# Patient Record
Sex: Female | Born: 1945 | Race: Black or African American | Hispanic: No | Smoking: Never smoker
Health system: Southern US, Community
[De-identification: ages and names within clinical notes are randomized; demographics above are authoritative.]

## PROBLEM LIST (undated history)

## (undated) DIAGNOSIS — K219 Gastro-esophageal reflux disease without esophagitis: Secondary | ICD-10-CM

## (undated) DIAGNOSIS — R519 Headache, unspecified: Secondary | ICD-10-CM

## (undated) DIAGNOSIS — M1990 Unspecified osteoarthritis, unspecified site: Secondary | ICD-10-CM

## (undated) DIAGNOSIS — Z87442 Personal history of urinary calculi: Secondary | ICD-10-CM

## (undated) DIAGNOSIS — G4730 Sleep apnea, unspecified: Secondary | ICD-10-CM

## (undated) DIAGNOSIS — E119 Type 2 diabetes mellitus without complications: Secondary | ICD-10-CM

## (undated) DIAGNOSIS — I10 Essential (primary) hypertension: Secondary | ICD-10-CM

## (undated) DIAGNOSIS — D649 Anemia, unspecified: Secondary | ICD-10-CM

## (undated) HISTORY — PX: WISDOM TOOTH EXTRACTION: SHX21

## (undated) HISTORY — PX: REPLACEMENT TOTAL KNEE BILATERAL: SUR1225

## (undated) HISTORY — PX: TONSILLECTOMY: SUR1361

## (undated) HISTORY — PX: EYE SURGERY: SHX253

## (undated) HISTORY — PX: CHOLECYSTECTOMY: SHX55

## (undated) HISTORY — PX: ANKLE SURGERY: SHX546

## (undated) HISTORY — PX: HAND SURGERY: SHX662

---

## 2018-08-11 ENCOUNTER — Ambulatory Visit (HOSPITAL_COMMUNITY)
Admission: EM | Admit: 2018-08-11 | Discharge: 2018-08-11 | Disposition: A | Discharge status: Home / Self Care | Payer: Medicare HMO | Attending: Family Medicine | Admitting: Family Medicine

## 2018-08-11 ENCOUNTER — Encounter (HOSPITAL_COMMUNITY): Payer: Self-pay

## 2018-08-11 ENCOUNTER — Other Ambulatory Visit: Payer: Self-pay

## 2018-08-11 DIAGNOSIS — M545 Low back pain, unspecified: Secondary | ICD-10-CM

## 2018-08-11 HISTORY — DX: Type 2 diabetes mellitus without complications: E11.9

## 2018-08-11 HISTORY — DX: Essential (primary) hypertension: I10

## 2018-08-11 MED ORDER — KETOROLAC TROMETHAMINE 30 MG/ML IJ SOLN
INTRAMUSCULAR | Status: AC
  Filled 2018-08-11: qty 1

## 2018-08-11 MED ORDER — KETOROLAC TROMETHAMINE 30 MG/ML IJ SOLN
30.0000 mg | Freq: Once | INTRAMUSCULAR | Status: AC
  Administered 2018-08-11: 30 mg via INTRAMUSCULAR

## 2018-08-11 NOTE — ED Triage Notes (Signed)
Pt presents today with left sided back pain. States it has been bothering her all week but got worse last night. Has not had any urinary sxs. Also states she started with sneezing yesterday and thinks she may have a cold.

## 2018-08-11 NOTE — ED Provider Notes (Signed)
[facility]   [id number] 08/11/18 Arrival Time: 1315  CC: Back pain  SUBJECTIVE: History from: patient. Alexandria Gibbs is a 73 y.o. female complains of left low back pain that began 1 week ago.  Denies a precipitating event or specific injury.  Localizes the pain to the left low back.  Describes the pain as intermittent and achy in character.  Has tried OTC medications like tylenol without relief.  Symptoms are made worse with standing from a seated position.  Reports similar symptoms in the past and diagnosed with pinched nerve, but pain today not as severe.  Denies fever, chills, erythema, ecchymosis, effusion, weakness, numbness and tingling, saddle paresthesias, loss of bowel or bladder function.    ROS: As per HPI.  Past Medical History:  Diagnosis Date  . Diabetes mellitus without complication (HCC)   . Hypertension    Past Surgical History:  Procedure Laterality Date  . CHOLECYSTECTOMY    . REPLACEMENT TOTAL KNEE BILATERAL    . TONSILLECTOMY     Allergies  Allergen Reactions  . Sulfa Antibiotics     Swelling and itching   No current facility-administered medications on file prior to encounter.    Current Outpatient Medications on File Prior to Encounter  Medication Sig Dispense Refill  . Exenatide ER (BYDUREON) 2 MG PEN Inject into the skin.    . metFORMIN (GLUCOPHAGE) 500 MG tablet 500 mg 2 (two) times daily with a meal.      Social History   Socioeconomic History  . Marital status: Widowed    Spouse name: Not on file  . Number of children: Not on file  . Years of education: Not on file  . Highest education level: Not on file  Occupational History  . Not on file  Social Needs  . Financial resource strain: Not on file  . Food insecurity:    Worry: Not on file    Inability: Not on file  . Transportation needs:    Medical: Not on file    Non-medical: Not on file  Tobacco Use  . Smoking status: Never Smoker  . Smokeless tobacco: Never Used    Substance and Sexual Activity  . Alcohol use: Not Currently  . Drug use: Not Currently  . Sexual activity: Not on file  Lifestyle  . Physical activity:    Days per week: Not on file    Minutes per session: Not on file  . Stress: Not on file  Relationships  . Social connections:    Talks on phone: Not on file    Gets together: Not on file    Attends religious service: Not on file    Active member of club or organization: Not on file    Attends meetings of clubs or organizations: Not on file    Relationship status: Not on file  . Intimate partner violence:    Fear of current or ex partner: Not on file    Emotionally abused: Not on file    Physically abused: Not on file    Forced sexual activity: Not on file  Other Topics Concern  . Not on file  Social History Narrative  . Not on file   No family history on file.  OBJECTIVE:  Vitals:   08/11/18 1349  BP: 135/65  Pulse: 71  Resp: 16  Temp: 98.4 F (36.9 C)  TempSrc: Oral  SpO2: 98%    General appearance: Alert; in no acute distress.  Head: NCAT Lungs: CTA bilaterally Heart: Soft  systolic murmur over aortic region Musculoskeletal: Back Inspection: Skin warm, dry, clear and intact without obvious erythema, effusion, or ecchymosis.  Palpation: TTP over left lower paravertebral muscles ROM: FROM active and passive Strength: 5/5 shld abduction, 5/5 shld adduction, 5/5 elbow flexion, 5/5 elbow extension, 5/5 grip strength, 5/5 hip flexion, 5/5 knee abduction, 5/5 knee adduction, 5/5 knee flexion, 5/5 knee extension, 5/5 dorsiflexion, 5/5 plantar flexion Skin: warm and dry Neurologic: Ambulates without difficulty; Sensation intact about the upper/ lower extremities Psychological: alert and cooperative; normal mood and affect  ASSESSMENT & PLAN:  1. Acute left-sided low back pain without sciatica     Meds ordered this encounter  Medications  . ketorolac (TORADOL) 30 MG/ML injection 30 mg   Toradol shot given in  office Continue conservative management of rest, ice, and gentle stretches Follow up with PCP or with Joaquin CourtsKimberly Harris FNP if symptoms persist Return or go to the ER if you have any new or worsening symptoms (fever, chills, chest pain, abdominal pain, changes in bowel or bladder habits, pain radiating into lower legs, etc...)   Reviewed expectations re: course of current medical issues. Questions answered. Outlined signs and symptoms indicating need for more acute intervention. Patient verbalized understanding. After Visit Summary given.    Rennis HardingWurst, Yaritzi Craun, PA-C 08/11/18 1459

## 2018-08-11 NOTE — Discharge Instructions (Signed)
Toradol shot given in office Continue conservative management of rest, ice, and gentle stretches Follow up with PCP or with Joaquin CourtsKimberly Harris FNP if symptoms persist Return or go to the ER if you have any new or worsening symptoms (fever, chills, chest pain, abdominal pain, changes in bowel or bladder habits, pain radiating into lower legs, etc...)

## 2018-09-17 ENCOUNTER — Encounter (HOSPITAL_COMMUNITY): Payer: Self-pay | Admitting: Emergency Medicine

## 2018-09-17 ENCOUNTER — Other Ambulatory Visit: Payer: Self-pay

## 2018-09-17 ENCOUNTER — Emergency Department (HOSPITAL_COMMUNITY)
Admission: EM | Admit: 2018-09-17 | Discharge: 2018-09-17 | Disposition: A | Discharge status: Home / Self Care | Payer: Medicare HMO | Attending: Emergency Medicine | Admitting: Emergency Medicine

## 2018-09-17 DIAGNOSIS — Z5321 Procedure and treatment not carried out due to patient leaving prior to being seen by health care provider: Secondary | ICD-10-CM | POA: Diagnosis not present

## 2018-09-17 DIAGNOSIS — R109 Unspecified abdominal pain: Secondary | ICD-10-CM | POA: Insufficient documentation

## 2018-09-17 NOTE — ED Notes (Signed)
Called for vitals x4. No reply.

## 2018-09-17 NOTE — ED Notes (Signed)
No reply x3. Not seen in lobby.

## 2018-09-17 NOTE — ED Triage Notes (Signed)
Pt c/o left flank pain x 3 months. Reports urinary frequency.

## 2018-09-18 ENCOUNTER — Encounter (HOSPITAL_COMMUNITY): Payer: Self-pay | Admitting: *Deleted

## 2018-09-18 ENCOUNTER — Emergency Department (HOSPITAL_COMMUNITY): Payer: Medicare HMO

## 2018-09-18 ENCOUNTER — Other Ambulatory Visit: Payer: Self-pay

## 2018-09-18 ENCOUNTER — Emergency Department (HOSPITAL_COMMUNITY)
Admission: EM | Admit: 2018-09-18 | Discharge: 2018-09-18 | Disposition: A | Discharge status: Home / Self Care | Payer: Medicare HMO | Attending: Emergency Medicine | Admitting: Emergency Medicine

## 2018-09-18 DIAGNOSIS — E119 Type 2 diabetes mellitus without complications: Secondary | ICD-10-CM | POA: Diagnosis not present

## 2018-09-18 DIAGNOSIS — I10 Essential (primary) hypertension: Secondary | ICD-10-CM | POA: Diagnosis not present

## 2018-09-18 DIAGNOSIS — Z96653 Presence of artificial knee joint, bilateral: Secondary | ICD-10-CM | POA: Diagnosis not present

## 2018-09-18 DIAGNOSIS — R109 Unspecified abdominal pain: Secondary | ICD-10-CM | POA: Insufficient documentation

## 2018-09-18 HISTORY — DX: Gastro-esophageal reflux disease without esophagitis: K21.9

## 2018-09-18 LAB — URINALYSIS, ROUTINE W REFLEX MICROSCOPIC
Bacteria, UA: NONE SEEN
Bilirubin Urine: NEGATIVE
Glucose, UA: NEGATIVE mg/dL
Hgb urine dipstick: NEGATIVE
KETONES UR: NEGATIVE mg/dL
Nitrite: NEGATIVE
Protein, ur: NEGATIVE mg/dL
Specific Gravity, Urine: 1.021 (ref 1.005–1.030)
pH: 5 (ref 5.0–8.0)

## 2018-09-18 MED ORDER — METHOCARBAMOL 500 MG PO TABS: 500.0000 mg | ORAL_TABLET | Freq: Three times a day (TID) | ORAL | 0 refills | Status: DC | PRN

## 2018-09-18 MED ORDER — IBUPROFEN 400 MG PO TABS: 400.0000 mg | ORAL_TABLET | Freq: Three times a day (TID) | ORAL | 0 refills | Status: DC | PRN

## 2018-09-18 MED ORDER — KETOROLAC TROMETHAMINE 60 MG/2ML IM SOLN
30.0000 mg | Freq: Once | INTRAMUSCULAR | Status: AC
  Administered 2018-09-18: 30 mg via INTRAMUSCULAR
  Filled 2018-09-18: qty 2

## 2018-09-18 MED ORDER — LORATADINE 10 MG PO TABS: 10.0000 mg | ORAL_TABLET | Freq: Every day | ORAL | 0 refills | Status: DC

## 2018-09-18 MED ORDER — METHOCARBAMOL 500 MG PO TABS
500.0000 mg | ORAL_TABLET | Freq: Once | ORAL | Status: AC
  Administered 2018-09-18: 500 mg via ORAL
  Filled 2018-09-18: qty 1

## 2018-09-18 NOTE — ED Provider Notes (Signed)
Emergency Department Provider Note   I have reviewed the triage vital signs and the nursing notes.   HISTORY  Chief Complaint Flank Pain (left)   HPI Alexandria Gibbs is a 73 y.o. female with medical problems documented below who presents emerged from today with left flank pain.  Patient states that she started having flank pain about a month ago seems to start in her back and radiate around her side to her anterior groin.  She been seen somewhere and got shot which seemed to make it better for a little while but then progressively came back.  Does seem to be worse with movement specifically leaning to the left.  She denies any rashes.  She had no urinary symptoms.  No nausea vomiting fever.  No other associated symptoms. No other associated or modifying symptoms.    Past Medical History:  Diagnosis Date  . Diabetes mellitus without complication (HCC)   . GERD (gastroesophageal reflux disease)   . Hypertension     There are no active problems to display for this patient.   Past Surgical History:  Procedure Laterality Date  . ANKLE SURGERY Left    ligament repair  . CHOLECYSTECTOMY    . HAND SURGERY Right   . REPLACEMENT TOTAL KNEE BILATERAL    . TONSILLECTOMY      Current Outpatient Rx  . Order #: [id number] Class: Historical Med  . Order #: [id number] Class: Historical Med  . Order #: [id number] Class: Historical Med  . Order #: [id number] Class: Historical Med  . Order #: [id number] Class: Historical Med  . Order #: [id number] Class: Historical Med  . Order #: [id number] Class: Historical Med  . Order #: [id number] Class: Print  . Order #: [id number] Class: Print  . Order #: [id number] Class: Print    Allergies Sulfa antibiotics  No family history on file.  Social History Social History   Tobacco Use  . Smoking status: Never Smoker  . Smokeless tobacco: Never Used  Substance Use Topics  . Alcohol use: Not Currently  . Drug use: Not Currently    Review of  Systems  All other systems negative except as documented in the HPI. All pertinent positives and negatives as reviewed in the HPI. ____________________________________________   PHYSICAL EXAM:  VITAL SIGNS: ED Triage Vitals  Enc Vitals Group     BP 09/18/18 1342 (!) 138/57     Pulse Rate 09/18/18 1342 81     Resp 09/18/18 1342 16     Temp 09/18/18 1342 98.7 F (37.1 C)     Temp Source 09/18/18 1342 Oral     SpO2 09/18/18 1342 99 %     Weight 09/18/18 1343 215 lb (97.5 kg)     Height 09/18/18 1343 5\' 5"  (1.651 m)     Head Circumference --      Peak Flow --      Pain Score 09/18/18 1342 6     Pain Loc --      Pain Edu? --      Excl. in GC? --     Constitutional: Alert and oriented. Well appearing and in no acute distress. Eyes: Conjunctivae are normal. PERRL. EOMI. Head: Atraumatic. Nose: No congestion/rhinnorhea. Mouth/Throat: Mucous membranes are moist.  Oropharynx non-erythematous. Neck: No stridor.  No meningeal signs.   Cardiovascular: Normal rate, regular rhythm. Good peripheral circulation. Grossly normal heart sounds.   Respiratory: Normal respiratory effort.  No retractions. Lungs CTAB. Gastrointestinal: Soft and nontender. No distention.  Musculoskeletal: No lower extremity tenderness nor edema. No gross deformities  of extremities. ttp to left paraspinal around upper lumbar area and left flank over lower ribs. No rash. No deformities.  Neurologic:  Normal speech and language. No gross focal neurologic deficits are appreciated.  Skin:  Skin is warm, dry and intact. No rash noted.   ____________________________________________   LABS (all labs ordered are listed, but only abnormal results are displayed)  Labs Reviewed  URINALYSIS, ROUTINE W REFLEX MICROSCOPIC - Abnormal; Notable for the following components:      Result Value   Leukocytes,Ua SMALL (*)    All other components within normal limits    ____________________________________________  RADIOLOGY  Dg Chest 2 View  Result Date: 09/18/2018 CLINICAL DATA:  Initial evaluation for acute left flank pain. EXAM: CHEST - 2 VIEW COMPARISON:  None. FINDINGS: Cardiac and mediastinal silhouettes are within normal limits. Aortic atherosclerosis noted. Lungs normally inflated. No focal infiltrates, pulmonary edema, or pleural effusion. No pneumothorax. No acute osseous finding. Prominent degenerative changes noted about the shoulders bilaterally. Multilevel endplate spurring noted within the thoracic spine. IMPRESSION: No active cardiopulmonary disease. Electronically Signed   By: Rise Mu M.D.   On: 09/18/2018 18:31   Dg Thoracic Spine W/swimmers  Result Date: 09/18/2018 CLINICAL DATA:  Initial evaluation for acute left flank pain. EXAM: THORACIC SPINE - 3 VIEWS COMPARISON:  None. FINDINGS: Dextroscoliosis of the upper thoracic spine. Alignment otherwise normal with preservation of the normal thoracic kyphosis. Vertebral body heights maintained without evidence for acute or chronic fracture. Moderate multilevel degenerative endplate spurring seen throughout the thoracic spine. No discrete osseous lesions. Visualized soft tissues within normal limits. IMPRESSION: 1. No radiographic evidence for acute abnormality within the thoracic spine. 2. Moderate multilevel degenerative endplate spurring throughout the thoracic spine. Electronically Signed   By: Rise Mu M.D.   On: 09/18/2018 18:35   Dg Lumbar Spine Complete  Result Date: 09/18/2018 CLINICAL DATA:  Initial evaluation for acute left flank pain. EXAM: LUMBAR SPINE - COMPLETE 4+ VIEW COMPARISON:  None. FINDINGS: Five non rib-bearing lumbar type vertebral bodies are present. Trace grade 1 anterolisthesis of L4 on L5. Alignment otherwise normal with preservation of the normal lumbar lordosis. Vertebral body heights maintained without evidence for acute or chronic fracture.  Visualized sacrum and pelvis intact. No discrete osseous lesions. Moderate degenerative facet arthrosis present within the lower lumbar spine. Multilevel prominent bridging endplate osteophytic spurring noted, most notable on the right. No acute soft tissue abnormality. Vascular calcifications noted within the abdomen. Cholecystectomy clips noted as well. IMPRESSION: 1. No radiographic evidence for acute abnormality within the lumbar spine. 2. 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 Electronically Signed   By: Rise Mu M.D.   On: 09/18/2018 18:29    ____________________________________________   PROCEDURES  Procedure(s) performed:   Procedures   ____________________________________________   INITIAL IMPRESSION / ASSESSMENT AND PLAN / ED COURSE  Skeletal lesion versus radiculopathy.  Has been going on for a while and she is 73 years old with history of diabetes we will get an x-ray and treat her symptomatically but if this is normal she will need to follow-up with a PCP for further work-up and management.  xr's ok. Symptoms improved. No e/o UTI. Low suspicion for kidney stone. Will fu w/ pcp for further management if not improving.   Pertinent labs & imaging results that were available during my care of the patient were reviewed by me and considered in my medical decision making (see chart for details).  ____________________________________________  FINAL CLINICAL  IMPRESSION(S) / ED DIAGNOSES  Final diagnoses:  Flank pain  Left flank pain     MEDICATIONS GIVEN DURING THIS VISIT:  Medications  ketorolac (TORADOL) injection 30 mg (30 mg Intramuscular Given 09/18/18 1646)  methocarbamol (ROBAXIN) tablet 500 mg (500 mg Oral Given 09/18/18 1646)     NEW OUTPATIENT MEDICATIONS STARTED DURING THIS VISIT:  Discharge Medication List as of 09/18/2018  7:20 PM    START taking these medications   Details  ibuprofen  (ADVIL,MOTRIN) 400 MG tablet Take 1 tablet (400 mg total) by mouth every 8 (eight) hours as needed., Starting Tue 09/18/2018, Print    loratadine (CLARITIN) 10 MG tablet Take 1 tablet (10 mg total) by mouth daily., Starting Tue 09/18/2018, Print    methocarbamol (ROBAXIN) 500 MG tablet Take 1 tablet (500 mg total) by mouth every 8 (eight) hours as needed for muscle spasms., Starting Tue 09/18/2018, Print        Note:  This note was prepared with assistance of Dragon voice recognition software. Occasional wrong-word or sound-a-like substitutions may have occurred due to the inherent limitations of voice recognition software.   Marily Memos, MD 09/18/18 [phone]

## 2018-09-18 NOTE — ED Triage Notes (Signed)
Pt states that she has been having left flank pain that started on Saturday.  Pt states that the pain feels like it is coming around her back and to the front.  Pt states that a month ago she had similar pain in which she went to UC and was given a shot.  Pt reports increased frequency in urination but denies any other urinary symptoms.  Pt a/o x 4 and ambulatory in triage.

## 2019-02-27 ENCOUNTER — Other Ambulatory Visit: Payer: Self-pay

## 2019-02-27 ENCOUNTER — Ambulatory Visit (HOSPITAL_COMMUNITY)
Admission: EM | Admit: 2019-02-27 | Discharge: 2019-02-27 | Disposition: A | Discharge status: Home / Self Care | Payer: Medicare HMO | Attending: Family Medicine | Admitting: Family Medicine

## 2019-02-27 ENCOUNTER — Encounter (HOSPITAL_COMMUNITY): Payer: Self-pay | Admitting: Emergency Medicine

## 2019-02-27 DIAGNOSIS — Z96653 Presence of artificial knee joint, bilateral: Secondary | ICD-10-CM | POA: Insufficient documentation

## 2019-02-27 DIAGNOSIS — Z20828 Contact with and (suspected) exposure to other viral communicable diseases: Secondary | ICD-10-CM | POA: Insufficient documentation

## 2019-02-27 DIAGNOSIS — Z7984 Long term (current) use of oral hypoglycemic drugs: Secondary | ICD-10-CM | POA: Diagnosis not present

## 2019-02-27 DIAGNOSIS — E119 Type 2 diabetes mellitus without complications: Secondary | ICD-10-CM | POA: Diagnosis not present

## 2019-02-27 DIAGNOSIS — Z882 Allergy status to sulfonamides status: Secondary | ICD-10-CM | POA: Insufficient documentation

## 2019-02-27 DIAGNOSIS — K219 Gastro-esophageal reflux disease without esophagitis: Secondary | ICD-10-CM | POA: Insufficient documentation

## 2019-02-27 DIAGNOSIS — J029 Acute pharyngitis, unspecified: Secondary | ICD-10-CM | POA: Insufficient documentation

## 2019-02-27 DIAGNOSIS — Z79899 Other long term (current) drug therapy: Secondary | ICD-10-CM | POA: Diagnosis not present

## 2019-02-27 DIAGNOSIS — I10 Essential (primary) hypertension: Secondary | ICD-10-CM | POA: Insufficient documentation

## 2019-02-27 LAB — POCT RAPID STREP A: Streptococcus, Group A Screen (Direct): NEGATIVE

## 2019-02-27 NOTE — ED Triage Notes (Signed)
Pt sts sore throat denies fever

## 2019-02-27 NOTE — Discharge Instructions (Signed)
Negative rapid strep.  Continue with your allergy medications. A daily nasal spray may also help.  Throat lozenges, gargles, chloraseptic spray, warm teas, popsicles etc to help with throat pain.   Will notify you if you have a positive covid test. You may monitor your results on your MyChart online as well.   If symptoms worsen or do not improve in the next week to return to be seen or to follow up with your PCP.

## 2019-02-27 NOTE — ED Provider Notes (Signed)
[facility]    CSN: [id number] Arrival date & time: 02/27/19  1513     History   Chief Complaint Chief Complaint  Patient presents with  . Sore Throat    HPI Alexandria Gibbs is a 73 y.o. female.   Alexandria Gibbs presents with complaints of sore throat. Started 1 week ago. Worse during the day, feels like her throat is dry. She feels like it is worse when she is in air conditioned environment. She does get post nasal drip. She takes allergy medications. Yesterday had body aches. No fevers. No headache. No gi symptoms. No cough no chest pain. No ear pain. She is concerned about covid-19. Her daughter has had a cough and congestion, and her daughter has an aid who helps her. Patient states the aid does not wear a mask. Symptoms are not worse at night. History  Of dm, gerd, htn, tonsillectomy.     ROS per HPI, negative if not otherwise mentioned.      Past Medical History:  Diagnosis Date  . Diabetes mellitus without complication (HCC)   . GERD (gastroesophageal reflux disease)   . Hypertension     There are no active problems to display for this patient.   Past Surgical History:  Procedure Laterality Date  . ANKLE SURGERY Left    ligament repair  . CHOLECYSTECTOMY    . HAND SURGERY Right   . REPLACEMENT TOTAL KNEE BILATERAL    . TONSILLECTOMY      OB History   No obstetric history on file.      Home Medications    Prior to Admission medications   Medication Sig Start Date End Date Taking? Authorizing Provider  benazepril-hydrochlorthiazide (LOTENSIN HCT) 20-12.5 MG tablet Take 1 tablet by mouth daily.  08/21/18   [provider]  Exenatide ER (BYDUREON) 2 MG PEN Inject 2 mg into the skin once a week.     [provider]  ibuprofen (ADVIL,MOTRIN) 400 MG tablet Take 1 tablet (400 mg total) by mouth every 8 (eight) hours as needed. 09/18/18   Mesner, Barbara CowerJason, MD  loratadine (CLARITIN) 10 MG tablet Take 1 tablet (10 mg total) by mouth  daily. 09/18/18   Mesner, Barbara CowerJason, MD  metFORMIN (GLUCOPHAGE) 500 MG tablet Take 500 mg by mouth 2 (two) times daily with a meal.    [provider]  methocarbamol (ROBAXIN) 500 MG tablet Take 1 tablet (500 mg total) by mouth every 8 (eight) hours as needed for muscle spasms. 09/18/18   Mesner, Barbara CowerJason, MD  pantoprazole (PROTONIX) 40 MG tablet Take 40 mg by mouth daily.  08/21/18   [provider]  pravastatin (PRAVACHOL) 40 MG tablet Take 40 mg by mouth daily.    [provider]  Prenatal 27-1 MG TABS Take 1 tablet by mouth daily. 07/02/18   [provider]  traMADol (ULTRAM) 50 MG tablet Take 50 mg by mouth every 6 (six) hours as needed for moderate pain or severe pain.    [provider]    Family History History reviewed. No pertinent family history.  Social History Social History   Tobacco Use  . Smoking status: Never Smoker  . Smokeless tobacco: Never Used  Substance Use Topics  . Alcohol use: Not Currently  . Drug use: Not Currently     Allergies   Sulfa antibiotics   Review of Systems Review of Systems   Physical Exam Triage Vital Signs ED Triage Vitals  Enc Vitals Group  BP 02/27/19 1534 (!) 160/76     Pulse Rate 02/27/19 1534 78     Resp 02/27/19 1534 18     Temp 02/27/19 1534 98.5 F (36.9 C)     Temp Source 02/27/19 1534 Oral     SpO2 02/27/19 1534 97 %     Weight --      Height --      Head Circumference --      Peak Flow --      Pain Score 02/27/19 1535 5     Pain Loc --      Pain Edu? --      Excl. in [place]? --    No data found.  Updated Vital Signs BP (!) 160/76 (BP Location: Right Arm)   Pulse 78   Temp 98.5 F (36.9 C) (Oral)   Resp 18   SpO2 97%    Physical Exam Constitutional:      General: She is not in acute distress.    Appearance: She is well-developed.  HENT:     Mouth/Throat:     Pharynx: No posterior oropharyngeal erythema.     Tonsils: 0 on the right. 0 on the left.   Cardiovascular:     Rate and Rhythm: Normal rate.     Heart sounds: Normal heart sounds.  Pulmonary:     Effort: Pulmonary effort is normal.  Skin:    General: Skin is warm and dry.  Neurological:     Mental Status: She is alert and oriented to person, place, and time.      UC Treatments / Results  Labs (all labs ordered are listed, but only abnormal results are displayed) Labs Reviewed  NOVEL CORONAVIRUS, NAA (HOSPITAL ORDER, SEND-OUT TO REF LAB)  CULTURE, GROUP A STREP [facility])  POCT RAPID STREP A    EKG   Radiology No results found.  Procedures Procedures (including critical care time)  Medications Ordered in UC Medications - No data to display  Initial Impression / Assessment and Plan / UC Course  I have reviewed the triage vital signs and the nursing notes.  Pertinent labs & imaging results that were available during my care of the patient were reviewed by me and considered in my medical decision making (see chart for details).     Non toxic. Benign physical exam.  Negative rapid strep. Supportive cares recommended. covid pending. Will notify of any positive findings and if any changes to treatment are needed.  If symptoms worsen or do not improve in the next week to return to be seen or to follow up with PCP. Patient verbalized understanding and agreeable to plan.  Ambulatory out of clinic without difficulty.    Final Clinical Impressions(s) / UC Diagnoses   Final diagnoses:  Acute pharyngitis, unspecified etiology     Discharge Instructions     Negative rapid strep.  Continue with your allergy medications. A daily nasal spray may also help.  Throat lozenges, gargles, chloraseptic spray, warm teas, popsicles etc to help with throat pain.   Will notify you if you have a positive covid test. You may monitor your results on your MyChart online as well.   If symptoms worsen or do not improve in the next week to return to be seen or to follow up with your PCP.       ED Prescriptions    None     Controlled Substance Prescriptions [place] Controlled Substance Registry consulted? Not Applicable   Zigmund Gottron, NP 02/27/19  1558  

## 2019-03-01 LAB — NOVEL CORONAVIRUS, NAA (HOSP ORDER, SEND-OUT TO REF LAB; TAT 18-24 HRS): SARS-CoV-2, NAA: NOT DETECTED

## 2019-03-02 LAB — CULTURE, GROUP A STREP (THRC)

## 2019-03-04 ENCOUNTER — Encounter (HOSPITAL_COMMUNITY): Payer: Self-pay

## 2019-03-04 ENCOUNTER — Other Ambulatory Visit: Payer: Self-pay

## 2019-03-04 ENCOUNTER — Encounter (HOSPITAL_COMMUNITY): Payer: Self-pay | Admitting: Emergency Medicine

## 2019-03-04 ENCOUNTER — Ambulatory Visit (HOSPITAL_COMMUNITY)
Admission: EM | Admit: 2019-03-04 | Discharge: 2019-03-04 | Disposition: A | Discharge status: Home / Self Care | Payer: Medicare HMO | Attending: Emergency Medicine | Admitting: Emergency Medicine

## 2019-03-04 DIAGNOSIS — R1032 Left lower quadrant pain: Secondary | ICD-10-CM | POA: Diagnosis present

## 2019-03-04 DIAGNOSIS — E119 Type 2 diabetes mellitus without complications: Secondary | ICD-10-CM | POA: Diagnosis not present

## 2019-03-04 DIAGNOSIS — Z20822 Contact with and (suspected) exposure to covid-19: Secondary | ICD-10-CM

## 2019-03-04 DIAGNOSIS — Z882 Allergy status to sulfonamides status: Secondary | ICD-10-CM | POA: Insufficient documentation

## 2019-03-04 DIAGNOSIS — Z7984 Long term (current) use of oral hypoglycemic drugs: Secondary | ICD-10-CM | POA: Insufficient documentation

## 2019-03-04 DIAGNOSIS — Z96653 Presence of artificial knee joint, bilateral: Secondary | ICD-10-CM | POA: Diagnosis not present

## 2019-03-04 DIAGNOSIS — J029 Acute pharyngitis, unspecified: Secondary | ICD-10-CM | POA: Diagnosis present

## 2019-03-04 DIAGNOSIS — Z20828 Contact with and (suspected) exposure to other viral communicable diseases: Secondary | ICD-10-CM | POA: Diagnosis not present

## 2019-03-04 DIAGNOSIS — K219 Gastro-esophageal reflux disease without esophagitis: Secondary | ICD-10-CM | POA: Insufficient documentation

## 2019-03-04 DIAGNOSIS — Z9049 Acquired absence of other specified parts of digestive tract: Secondary | ICD-10-CM | POA: Insufficient documentation

## 2019-03-04 DIAGNOSIS — I10 Essential (primary) hypertension: Secondary | ICD-10-CM | POA: Insufficient documentation

## 2019-03-04 DIAGNOSIS — Z79899 Other long term (current) drug therapy: Secondary | ICD-10-CM | POA: Diagnosis not present

## 2019-03-04 LAB — POCT RAPID STREP A: Streptococcus, Group A Screen (Direct): NEGATIVE

## 2019-03-04 NOTE — ED Triage Notes (Signed)
Sore throat 3-4 days. Also reports pain in abdomen. States she has pain over lower ribs that is worse when sneezing. States abdominal pain feels like labor pain. Bodyaches as well.

## 2019-03-04 NOTE — Discharge Instructions (Addendum)
Your rapid strep test was negative; a culture is pending.  Your COVID test is pending.  You should self quarantine until your test result is back and is negative.    Go to the emergency department if you develop abdominal pain, difficulty swallowing, difficulty breathing, shortness of breath, high fever, vomiting, severe diarrhea, rash, or other concerning symptoms.

## 2019-03-04 NOTE — ED Provider Notes (Signed)
[facility]    CSN: [id number] Arrival date & time: 03/04/19  1347      History   Chief Complaint Chief Complaint  Patient presents with  . Sore Throat  . Abdominal Cramping    HPI Alexandria Gibbs is a 73 y.o. female.   Patient presents with 1 week history of sore throat, body aches, LLQ abdominal pain, left rib pain which is worse with sneezing.  She states her symptoms are worse when in air conditioning and improve when she wraps a hot towel around her throat with Vicks vapor rub.  She describes her abdominal pain as "cramping" and states it feels like labor pains; 4/10.  She denies fever, chills, ear pain, cough, shortness of breath, vomiting, diarrhea, rash, or other symptoms.  She denies falls or injury.  Last BM: Yesterday.  She was seen here on 02/27/2019 diagnosed with acute pharyngitis of unknown etiology; her throat culture and COVID were negative.    The history is provided by the patient.    Past Medical History:  Diagnosis Date  . Diabetes mellitus without complication (HCC)   . GERD (gastroesophageal reflux disease)   . Hypertension     There are no active problems to display for this patient.   Past Surgical History:  Procedure Laterality Date  . ANKLE SURGERY Left    ligament repair  . CHOLECYSTECTOMY    . HAND SURGERY Right   . REPLACEMENT TOTAL KNEE BILATERAL    . TONSILLECTOMY      OB History   No obstetric history on file.      Home Medications    Prior to Admission medications   Medication Sig Start Date End Date Taking? Authorizing Provider  benazepril-hydrochlorthiazide (LOTENSIN HCT) 20-12.5 MG tablet Take 1 tablet by mouth daily.  08/21/18   [provider]  Exenatide ER (BYDUREON) 2 MG PEN Inject 2 mg into the skin once a week.     [provider]  ibuprofen (ADVIL,MOTRIN) 400 MG tablet Take 1 tablet (400 mg total) by mouth every 8 (eight) hours as needed. 09/18/18   Mesner, Barbara CowerJason, MD  loratadine (CLARITIN) 10  MG tablet Take 1 tablet (10 mg total) by mouth daily. 09/18/18   Mesner, Barbara CowerJason, MD  metFORMIN (GLUCOPHAGE) 500 MG tablet Take 500 mg by mouth 2 (two) times daily with a meal.    [provider]  methocarbamol (ROBAXIN) 500 MG tablet Take 1 tablet (500 mg total) by mouth every 8 (eight) hours as needed for muscle spasms. 09/18/18   Mesner, Barbara CowerJason, MD  pantoprazole (PROTONIX) 40 MG tablet Take 40 mg by mouth daily.  08/21/18   [provider]  pravastatin (PRAVACHOL) 40 MG tablet Take 40 mg by mouth daily.    [provider]  Prenatal 27-1 MG TABS Take 1 tablet by mouth daily. 07/02/18   [provider]  traMADol (ULTRAM) 50 MG tablet Take 50 mg by mouth every 6 (six) hours as needed for moderate pain or severe pain.    [provider]    Family History No family history on file.  Social History Social History   Tobacco Use  . Smoking status: Never Smoker  . Smokeless tobacco: Never Used  Substance Use Topics  . Alcohol use: Not Currently  . Drug use: Not Currently     Allergies   Sulfa antibiotics   Review of Systems Review of Systems  Constitutional: Negative for chills and fever.  HENT: Positive for sore throat. Negative  for ear pain.   Eyes: Negative for pain and visual disturbance.  Respiratory: Negative for cough.   Cardiovascular: Negative for palpitations.  Gastrointestinal: Positive for abdominal pain. Negative for constipation, diarrhea, nausea and vomiting.  Genitourinary: Negative for dysuria and hematuria.  Musculoskeletal: Negative for arthralgias and back pain.  Skin: Negative for color change and rash.  Neurological: Negative for seizures and syncope.  All other systems reviewed and are negative.    Physical Exam Triage Vital Signs ED Triage Vitals  Enc Vitals Group     BP 03/04/19 1512 (!) 154/65     Pulse Rate 03/04/19 1511 65     Resp 03/04/19 1511 16     Temp 03/04/19 1511 98.5 F (36.9 C)     Temp Source  03/04/19 1511 Oral     SpO2 03/04/19 1511 100 %     Weight --      Height --      Head Circumference --      Peak Flow --      Pain Score 03/04/19 1508 2     Pain Loc --      Pain Edu? --      Excl. in [place]? --    No data found.  Updated Vital Signs BP (!) 154/65   Pulse 65   Temp 98.5 F (36.9 C) (Oral)   Resp 16   SpO2 100%   Visual Acuity Right Eye Distance:   Left Eye Distance:   Bilateral Distance:    Right Eye Near:   Left Eye Near:    Bilateral Near:     Physical Exam Vitals signs and nursing note reviewed.  Constitutional:      General: She is not in acute distress.    Appearance: She is well-developed. She is not ill-appearing.  HENT:     Head: Normocephalic and atraumatic.     Right Ear: Tympanic membrane normal.     Left Ear: Tympanic membrane normal.     Nose: Nose normal.     Mouth/Throat:     Mouth: Mucous membranes are moist.     Pharynx: Oropharynx is clear.  Eyes:     Conjunctiva/sclera: Conjunctivae normal.  Neck:     Musculoskeletal: Neck supple.  Cardiovascular:     Rate and Rhythm: Normal rate and regular rhythm.     Heart sounds: No murmur.  Pulmonary:     Effort: Pulmonary effort is normal. No respiratory distress.     Breath sounds: Normal breath sounds.  Abdominal:     General: Bowel sounds are normal.     Palpations: Abdomen is soft.     Tenderness: There is no abdominal tenderness. There is no right CVA tenderness, left CVA tenderness, guarding or rebound.  Skin:    General: Skin is warm and dry.     Findings: No rash.  Neurological:     Mental Status: She is alert.      UC Treatments / Results  Labs (all labs ordered are listed, but only abnormal results are displayed) Labs Reviewed  NOVEL CORONAVIRUS, NAA (HOSPITAL ORDER, SEND-OUT TO REF LAB)  CULTURE, GROUP A STREP [facility])  POCT RAPID STREP A    EKG   Radiology No results found.  Procedures Procedures (including critical care time)  Medications Ordered in  UC Medications - No data to display  Initial Impression / Assessment and Plan / UC Course  I have reviewed the triage vital signs and the nursing notes.  Pertinent labs &  imaging results that were available during my care of the patient were reviewed by me and considered in my medical decision making (see chart for details).   Sore throat, LLQ abdominal pain, suspected COVID.  Patient is well-appearing and her exam is unremarkable.  Rapid strep negative; culture pending.  COVID test performed here.  Instructed patient to self quarantine until her COVID test is back.  Discussed with patient that she should go to the emergency department if she develops worsening abdominal pain, difficulty swallowing, difficulty breathing, shortness of breath, high fever, vomiting, diarrhea, rash, or other concerning symptoms.     Final Clinical Impressions(s) / UC Diagnoses   Final diagnoses:  Sore throat  Left lower quadrant abdominal pain  Suspected Covid-19 Virus Infection     Discharge Instructions     Your rapid strep test was negative; a culture is pending.  Your COVID test is pending.  You should self quarantine until your test result is back and is negative.    Go to the emergency department if you develop abdominal pain, difficulty swallowing, difficulty breathing, shortness of breath, high fever, vomiting, severe diarrhea, rash, or other concerning symptoms.        ED Prescriptions    None     Controlled Substance Prescriptions [place] Controlled Substance Registry consulted? Not Applicable   Mickie Bailate, Zeniah Briney H, NP 03/04/19 1555

## 2019-03-06 LAB — NOVEL CORONAVIRUS, NAA (HOSP ORDER, SEND-OUT TO REF LAB; TAT 18-24 HRS): SARS-CoV-2, NAA: NOT DETECTED

## 2019-03-07 LAB — CULTURE, GROUP A STREP (THRC)

## 2019-03-08 ENCOUNTER — Encounter (HOSPITAL_COMMUNITY): Payer: Self-pay

## 2019-05-21 ENCOUNTER — Emergency Department (HOSPITAL_COMMUNITY)
Admission: EM | Admit: 2019-05-21 | Discharge: 2019-05-21 | Discharge status: Against Medical Advice | Payer: Medicare HMO | Attending: Emergency Medicine | Admitting: Emergency Medicine

## 2019-05-21 ENCOUNTER — Other Ambulatory Visit: Payer: Self-pay

## 2019-05-21 DIAGNOSIS — Z5321 Procedure and treatment not carried out due to patient leaving prior to being seen by health care provider: Secondary | ICD-10-CM | POA: Insufficient documentation

## 2019-05-21 DIAGNOSIS — M545 Low back pain: Secondary | ICD-10-CM | POA: Diagnosis present

## 2019-05-21 NOTE — ED Triage Notes (Signed)
Pt in with c/o L low back pain, radiates to LLQ. States she woke up with this sharp pain, worse with movement. Denies any injury or urinary symptoms

## 2019-05-21 NOTE — ED Notes (Signed)
Pt states she is leaving. RN advised against leaving.

## 2019-05-26 IMAGING — CR CHEST - 2 VIEW
2 series · 2 of 2 positions shown · non-contrast
Comparison: None.

CLINICAL DATA: Initial evaluation for acute left flank pain.

EXAM:
CHEST - 2 VIEW

[w chest pa]
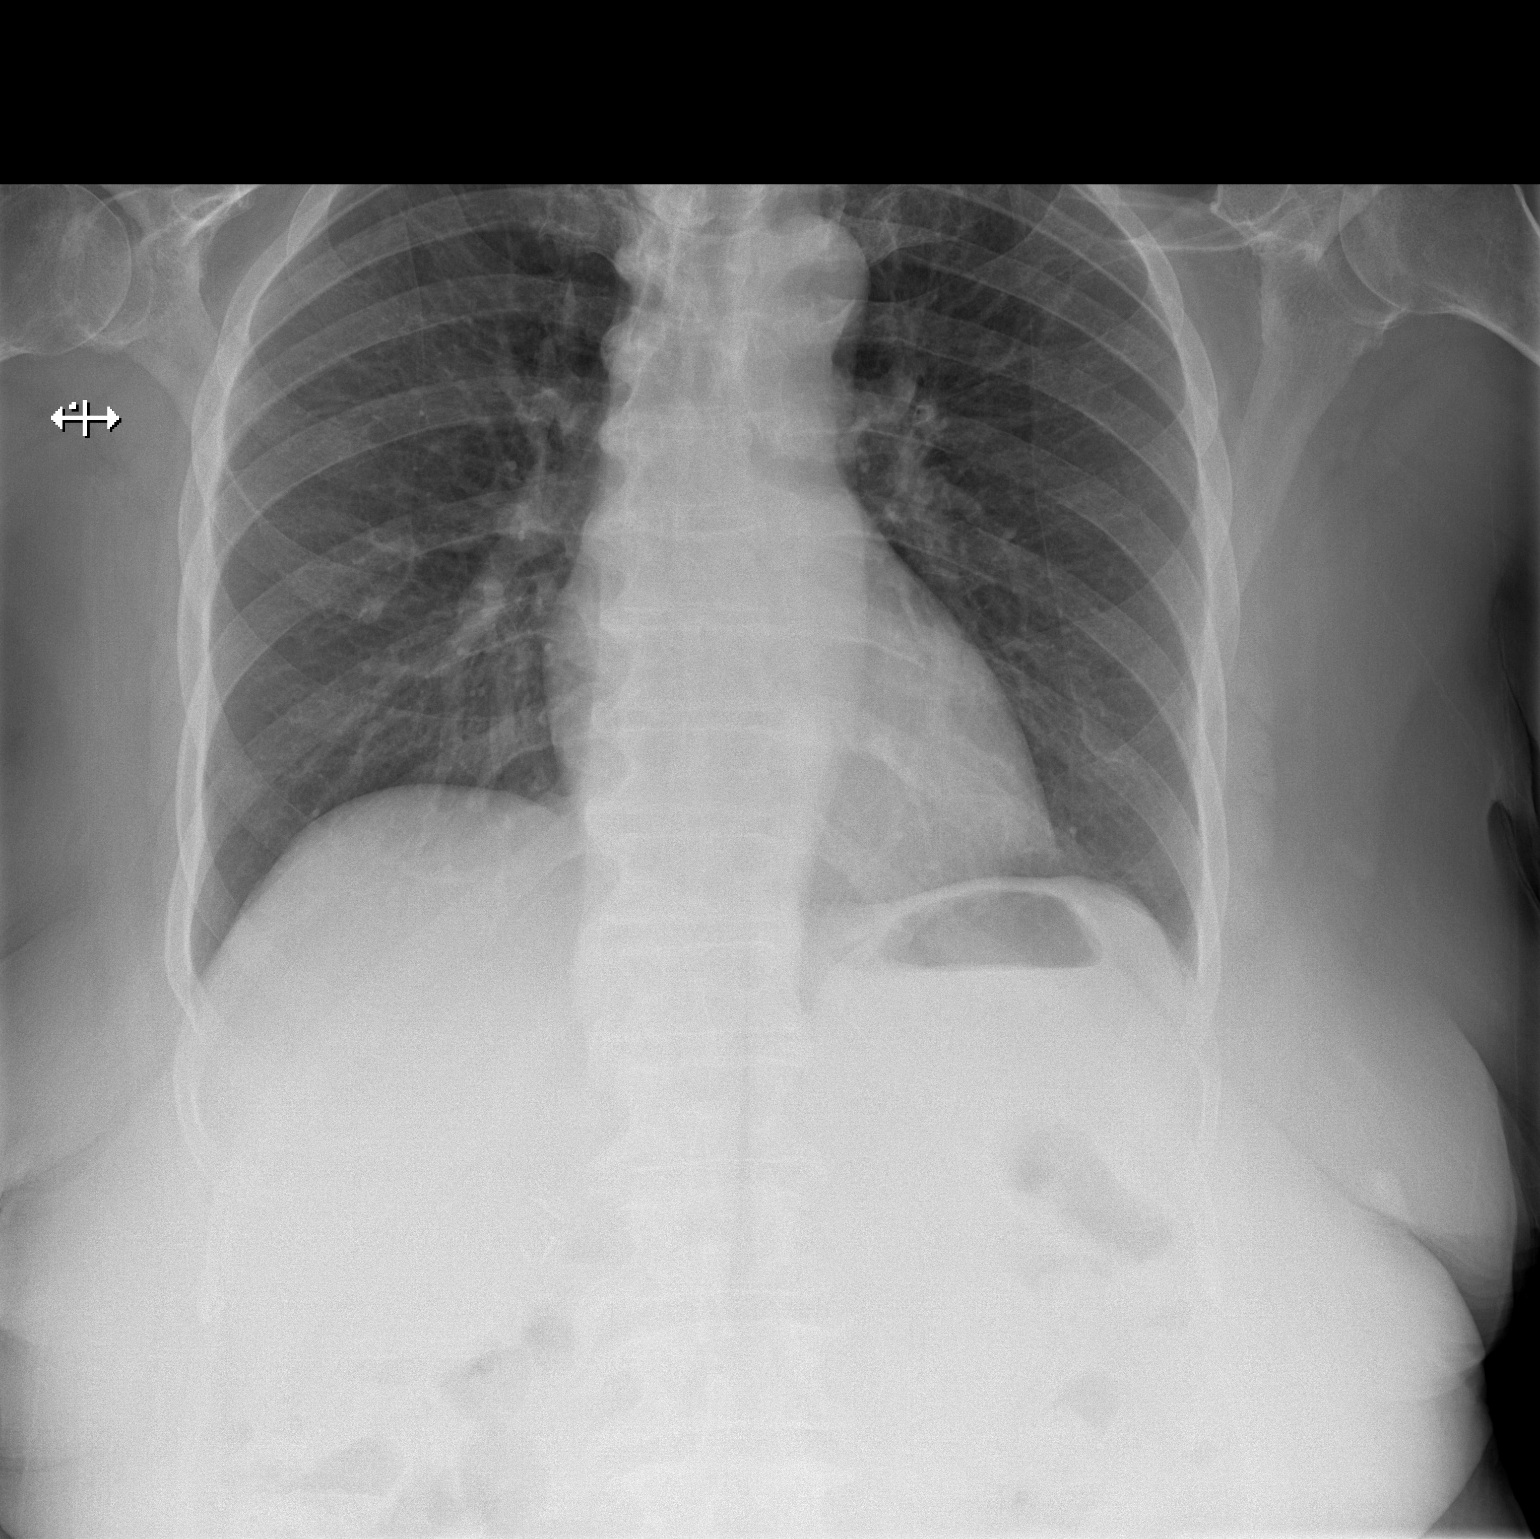

[w chest lat]
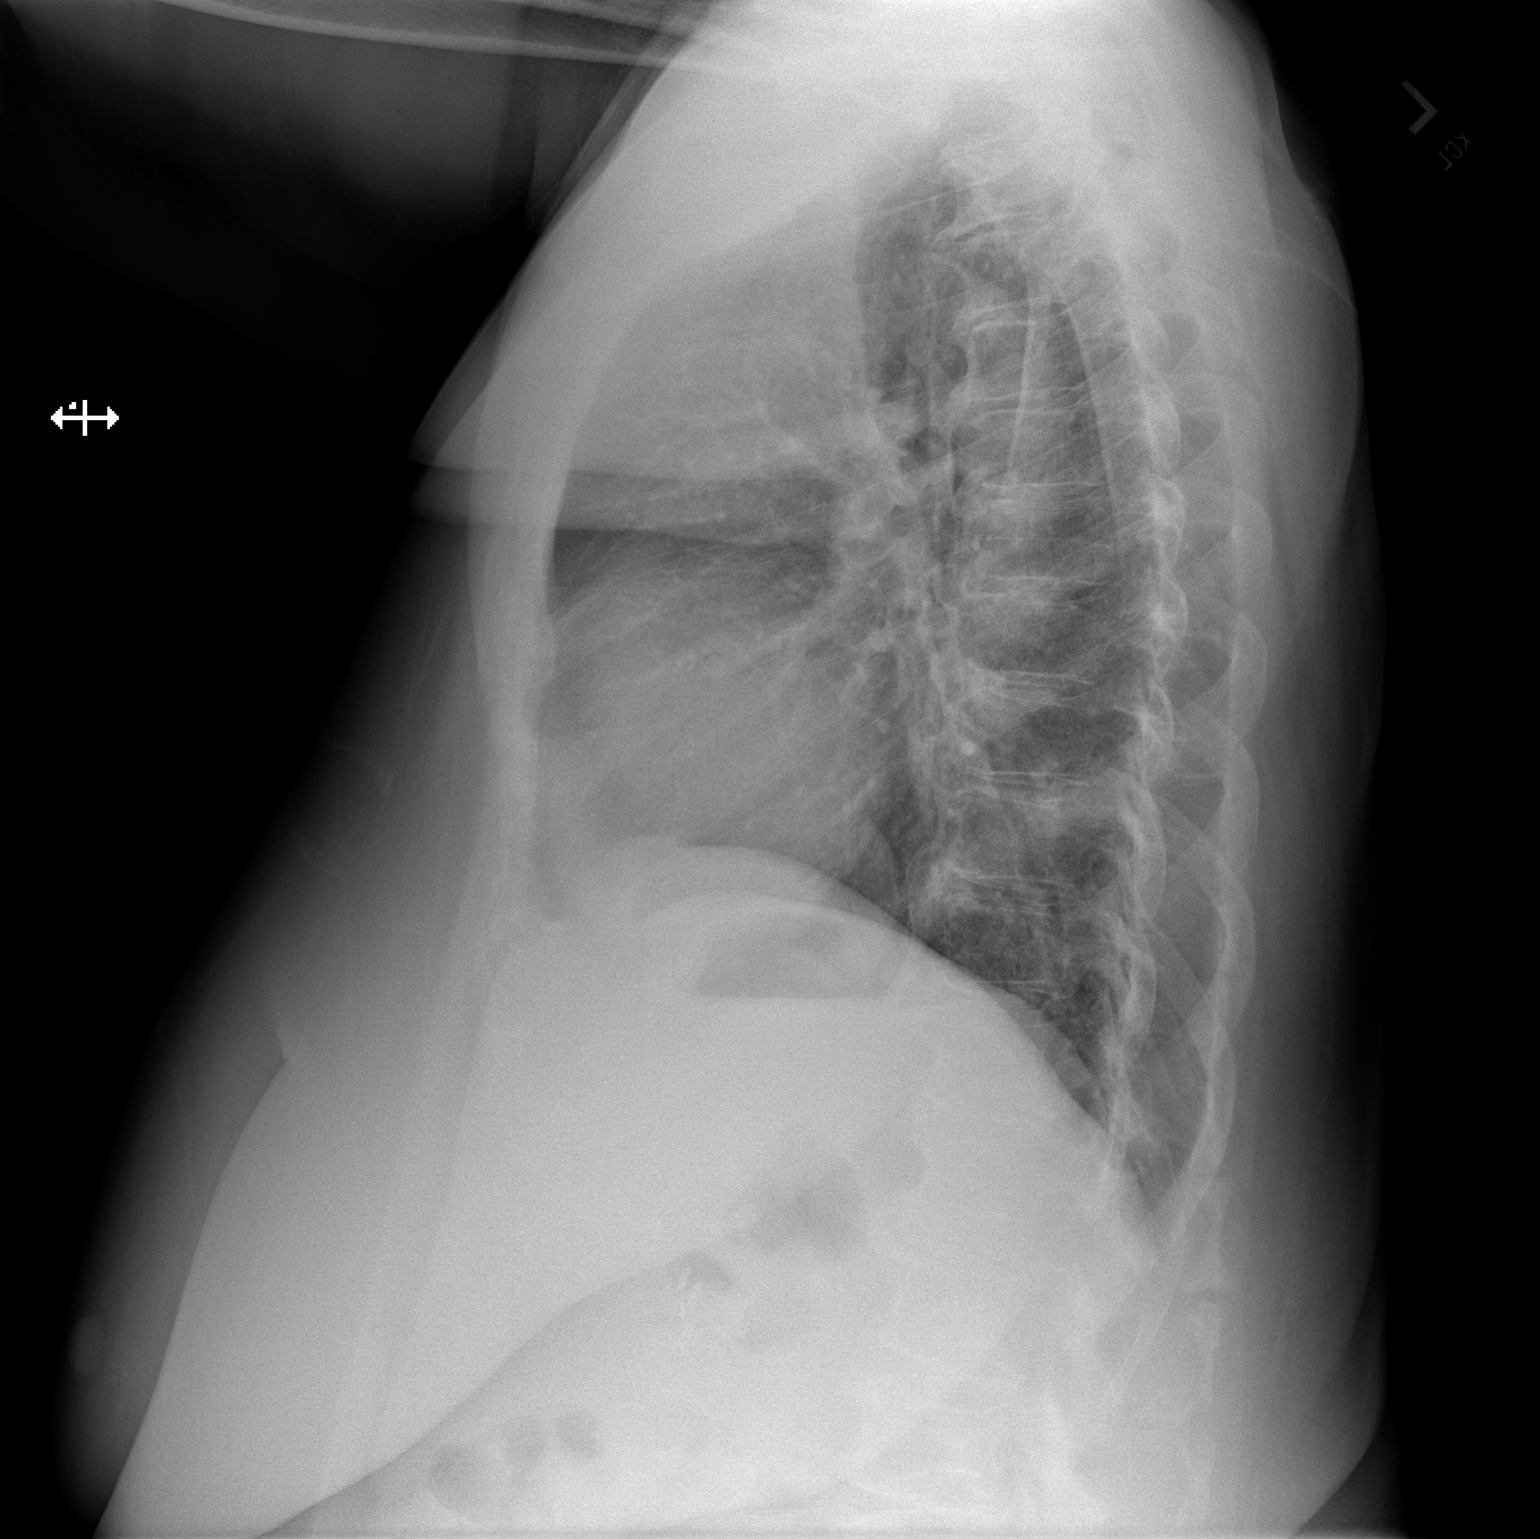

[2 of 2 positions shown; findings below may reference images not displayed]

FINDINGS: Cardiac and mediastinal silhouettes are within normal limits. Aortic
atherosclerosis noted.

Lungs normally inflated. No focal infiltrates, pulmonary edema, or
pleural effusion. No pneumothorax.

No acute osseous finding. Prominent degenerative changes noted about
the shoulders bilaterally. Multilevel endplate spurring noted within
the thoracic spine.
IMPRESSION: No active cardiopulmonary disease.

## 2019-05-26 IMAGING — CR LUMBAR SPINE - COMPLETE 4+ VIEW
6 series · 6 of 6 positions shown · non-contrast
Comparison: None.

CLINICAL DATA: Initial evaluation for acute left flank pain.

EXAM:
LUMBAR SPINE - COMPLETE 4+ VIEW

[t lumbar spine ap]
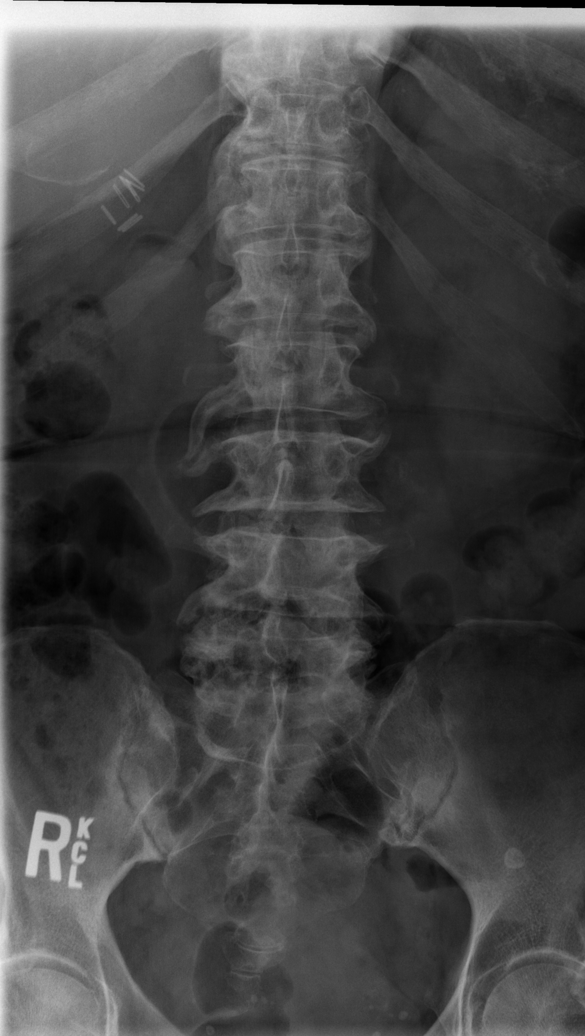

[t lumbar spine obl (1 of 3)]
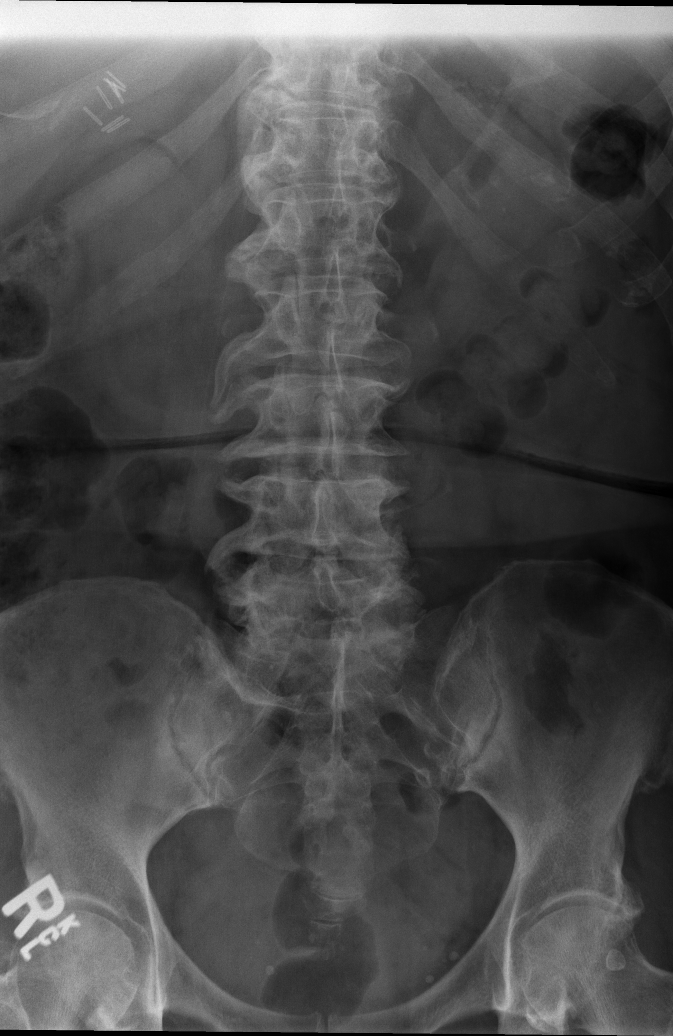

[t lumbar spine obl (2 of 3)]
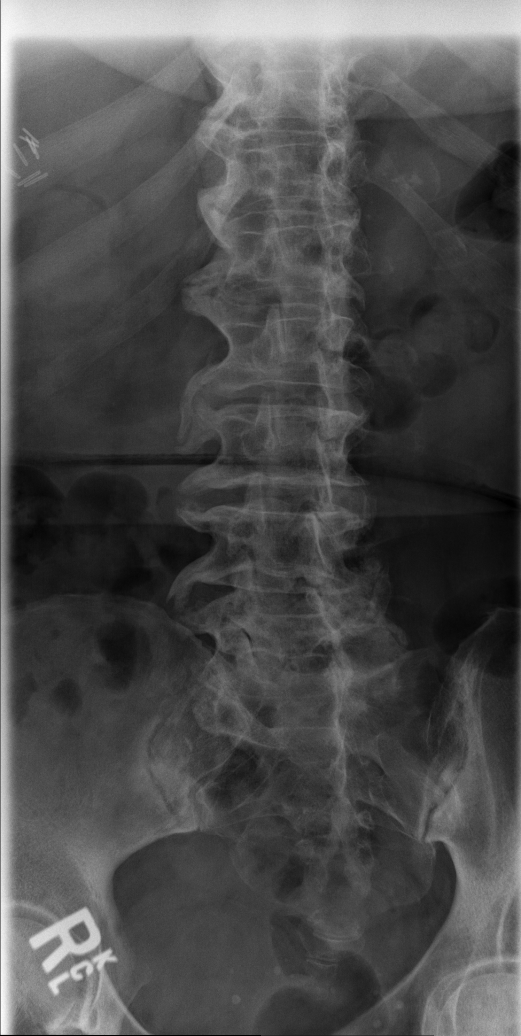

[t lumbar spine obl (3 of 3)]
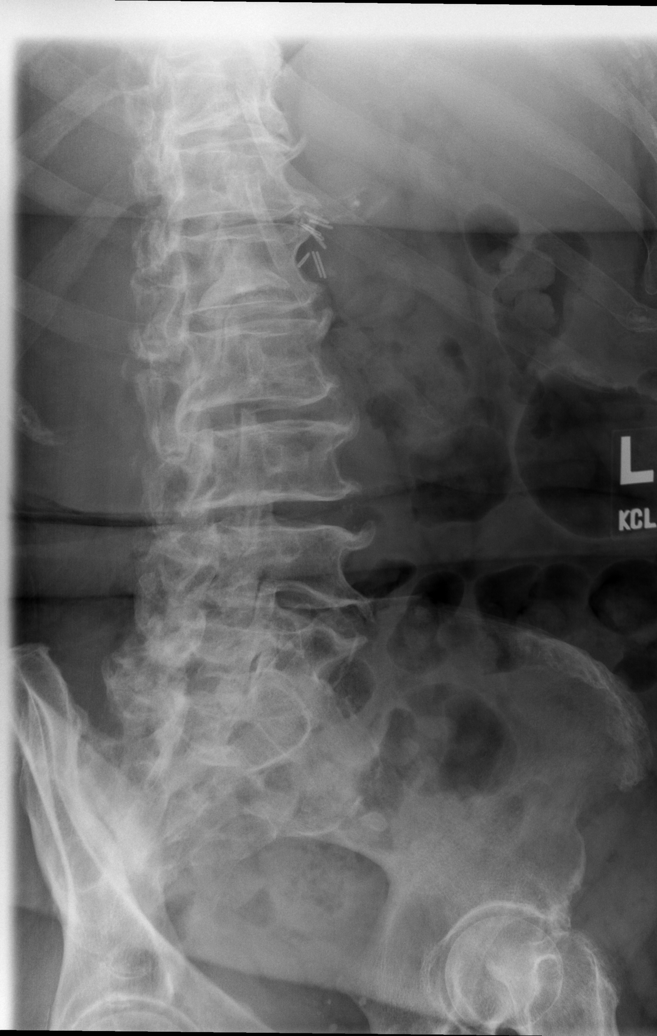

[t lumbar spine lat]
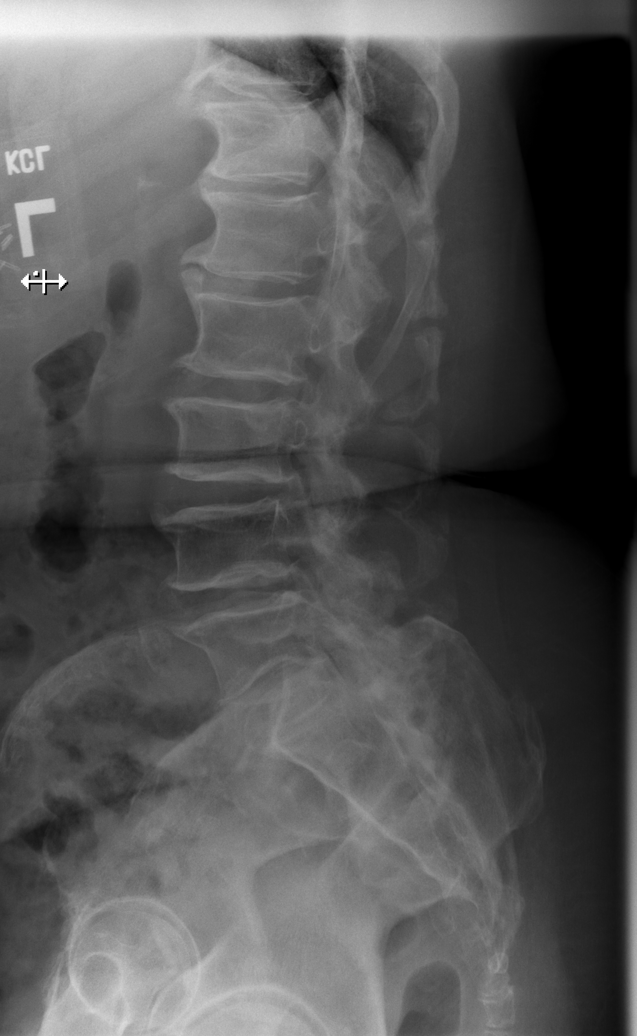

[t lumbar l-5 s-1 spot]
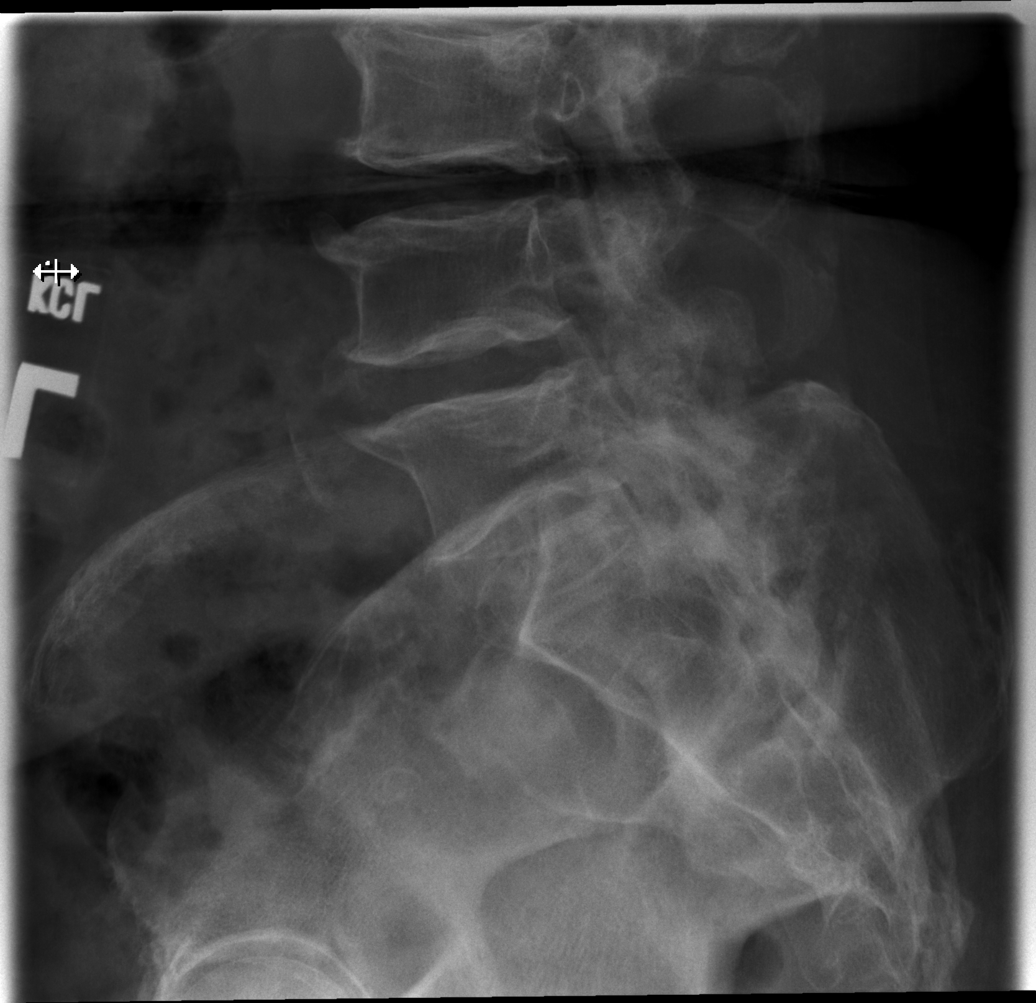

[6 of 6 positions shown; findings below may reference images not displayed]

FINDINGS: Five non rib-bearing lumbar type vertebral bodies are present. Trace
grade 1 anterolisthesis of L4 on L5. Alignment otherwise normal with
preservation of the normal lumbar lordosis. Vertebral body heights
maintained without evidence for acute or chronic fracture.
Visualized sacrum and pelvis intact. No discrete osseous lesions.

Moderate degenerative facet arthrosis present within the lower
lumbar spine. Multilevel prominent bridging endplate osteophytic
spurring noted, most notable on the right.

No acute soft tissue abnormality. Vascular calcifications noted
within the abdomen. Cholecystectomy clips noted as well.
IMPRESSION: 1. No radiographic evidence for acute abnormality within the lumbar
spine.
2. Moderate degenerative spondylolysis and facet arthrosis
throughout the lumbar spine.

## 2020-01-29 ENCOUNTER — Ambulatory Visit: Payer: Self-pay | Admitting: Student

## 2020-02-17 NOTE — Progress Notes (Addendum)
COVID Vaccine Completed:  x2 Date COVID Vaccine completed:  08-16-19 & 09-13-19  COVID vaccine manufacturer: Pfizer    Moderna   [employer]   PCP - Skip Estimable in [place], [place] Cardiologist - Has seen one in the past, unsure of name  Chest x-ray -  EKG - 02-24-2020 Stress Test - July 2021 at [facility].  Results on chart. ECHO -  Cardiac Cath -   Sleep Study - Not recent, diagnosed with sleep apnea CPAP - Does not use  Fasting Blood Sugar - 120 to 150 Checks Blood Sugar occasionally  Blood Thinner Instructions: N/A Aspirin Instructions:N/A Last Dose:  Anesthesia review:  Sleep apnea, does not wear CPAP.  Recent stress test (on chart)  Patient denies shortness of breath, fever, cough and chest pain at PAT appointment   Patient verbalized understanding of instructions that were given to them at the PAT appointment. Patient was also instructed that they will need to review over the PAT instructions again at home before surgery.

## 2020-02-17 NOTE — Patient Instructions (Addendum)
DUE TO COVID-19 ONLY ONE VISITOR IS ALLOWED TO COME WITH YOU AND STAY IN THE WAITING ROOM ONLY DURING PRE OP AND PROCEDURE.   IF YOU WILL BE ADMITTED INTO THE HOSPITAL YOU ARE ALLOWED ONE SUPPORT PERSON DURING VISITATION HOURS ONLY (10AM -8PM)   . The support person may change daily. . The support person must pass our screening, gel in and out, and wear a mask at all times, including in the patient's room. . Patients must also wear a mask when staff or their support person are in the room.   COVID SWAB TESTING MUST BE COMPLETED ON:  Monday, 02-24-20@ 2:35 PM    [address] [place], [place] [id number]  (Must self quarantine after testing. Follow instructions on handout.)     Your procedure is scheduled on:  Thursday, 02-27-20   [facility]  Entr[facility]   Report to Short Stay at 5:30 AM   [facility](Map Enclosed)      Call this number if you have problems the morning of surgery [phone]   Do not eat food :After Midnight.   May have liquids until 4:30 AM  day of surgery  CLEAR LIQUID DIET  Foods Allowed                                                                     Foods Excluded  Water, Black Coffee and tea, regular and decaf            liquids that you cannot  Plain Jell-O in any flavor  (No red)                                   see through such as: Fruit ices (not with fruit pulp)                                      milk, soups, orange juice              Iced Popsicles (No red)                                     All solid food                                   Apple juices Sports drinks like Gatorade (No red) Lightly seasoned clear broth or consume(fat free) Sugar, honey syrup    Complete one G2 drink the morning of surgery at  4:30 AM the day of surgery.     Oral Hygiene is also important to reduce your risk of infection.                                     Remember - BRUSH YOUR TEETH THE MORNING OF SURGERY WITH YOUR REGULAR TOOTHPASTE   Do NOT  smoke after Midnight   Take these  medicines the morning of surgery with A SIP OF WATER: Cetirizine, Gabapentin, Montelukast, Pantoprazole, Okay to  use inhaler                    How to Manage Your Diabetes Before and After Surgery  Why is it important to control my blood sugar before and after surgery? . Improving blood sugar levels before and after surgery helps healing and can limit problems. . A way of improving blood sugar control is eating a healthy diet by: o  Eating less sugar and carbohydrates o  Increasing activity/exercise o  Talking with your doctor about reaching your blood sugar goals . High blood sugars (greater than 180 mg/dL) can raise your risk of infections and slow your recovery, so you will need to focus on controlling your diabetes during the weeks before surgery. . Make sure that the doctor who takes care of your diabetes knows about your planned surgery including the date and location.  How do I manage my blood sugar before surgery? . Check your blood sugar at least 4 times a day, starting 2 days before surgery, to make sure that the level is not too high or low. o Check your blood sugar the morning of your surgery when you wake up and every 2 hours until you get to the Short Stay unit. . If your blood sugar is less than 70 mg/dL, you will need to treat for low blood sugar: o Do not take insulin. o Treat a low blood sugar (less than 70 mg/dL) with  cup of clear juice (cranberry or apple), 4 glucose tablets, OR glucose gel. o Recheck blood sugar in 15 minutes after treatment (to make sure it is greater than 70 mg/dL). If your blood sugar is not greater than 70 mg/dL on recheck, call [phone] for further instructions. . Report your blood sugar to the short stay nurse when you get to Short Stay.  . If you are admitted to the hospital after surgery: o Your blood sugar will be checked by the staff and you will probably be given insulin after surgery (instead of oral  diabetes medicines) to make sure you have good blood sugar levels. o The goal for blood sugar control after surgery is 80-180 mg/dL.   WHAT DO I DO ABOUT MY DIABETES MEDICATION?  Marland Kitchen Do not take oral diabetes medicines (pills) the morning of surgery.  . THE DAY BEFORE SURGERY:  Do not take Xigduo.        . THE MORNING OF SURGERY:  Do not take Xigduo or Trulicity.  . The day of surgery, do not take other diabetes injectables, including Byetta (exenatide), Bydureon (exenatide ER), Victoza (liraglutide), or Trulicity (dulaglutide).   Reviewed and Endorsed by [facility] Patient Education Committee, August 2015               You may not have any metal on your body including hair pins, jewelry, and body piercings             Do not wear make-up, lotions, powders, perfumes/cologne, or deodorant             Do not wear nail polish.  Do not shave  48 hours prior to surgery.               Do not bring valuables to the hospital. [facility] IS NOT  RESPONSIBLE   FOR VALUABLES.   Contacts, dentures or bridgework may not be worn into surgery.  Bring small overnight bag day of surgery.                 Please read over the following fact sheets you were given: IF YOU HAVE QUESTIONS ABOUT YOUR PRE OP INSTRUCTIONS  PLEASE CALL  [phone]   [facility] - Preparing for Surgery Before surgery, you can play an important role.  Because skin is not sterile, your skin needs to be as free of germs as possible.  You can reduce the number of germs on your skin by washing with CHG (chlorahexidine gluconate) soap before surgery.  CHG is an antiseptic cleaner which kills germs and bonds with the skin to continue killing germs even after washing. Please DO NOT use if you have an allergy to CHG or antibacterial soaps.  If your skin becomes reddened/irritated stop using the CHG and inform your nurse when you arrive at Short Stay. Do not shave (including legs and underarms) for at least 48 hours prior to the  first CHG shower.  You may shave your face/neck.  Please follow these instructions carefully:  1.  Shower with CHG Soap the night before surgery and the  morning of surgery.  2.  If you choose to wash your hair, wash your hair first as usual with your normal  shampoo.  3.  After you shampoo, rinse your hair and body thoroughly to remove the shampoo.                             4.  Use CHG as you would any other liquid soap.  You can apply chg directly to the skin and wash.  Gently with a scrungie or clean washcloth.  5.  Apply the CHG Soap to your body ONLY FROM THE NECK DOWN.   Do   not use on face/ open                           Wound or open sores. Avoid contact with eyes, ears mouth and   genitals (private parts).                       Wash face,  Genitals (private parts) with your normal soap.             6.  Wash thoroughly, paying special attention to the area where your    surgery  will be performed.  7.  Thoroughly rinse your body with warm water from the neck down.  8.  DO NOT shower/wash with your normal soap after using and rinsing off the CHG Soap.                9.  Pat yourself dry with a clean towel.            10.  Wear clean pajamas.            11.  Place clean sheets on your bed the night of your first shower and do not  sleep with pets. Day of Surgery : Do not apply any lotions/deodorants the morning of surgery.  Please wear clean clothes to the hospital/surgery center.  FAILURE TO FOLLOW THESE INSTRUCTIONS MAY RESULT IN THE CANCELLATION OF YOUR SURGERY  PATIENT SIGNATURE_________________________________  NURSE SIGNATURE__________________________________  ________________________________________________________________________    Rogelia Mire  An incentive spirometer is a tool that can help keep your lungs clear and active. This tool  measures how well you are filling your lungs with each breath. Taking long deep breaths may help reverse or decrease the  chance of developing breathing (pulmonary) problems (especially infection) following:  A long period of time when you are unable to move or be active. BEFORE THE PROCEDURE   If the spirometer includes an indicator to show your best effort, your nurse or respiratory therapist will set it to a desired goal.  If possible, sit up straight or lean slightly forward. Try not to slouch.  Hold the incentive spirometer in an upright position. INSTRUCTIONS FOR USE  1. Sit on the edge of your bed if possible, or sit up as far as you can in bed or on a chair. 2. Hold the incentive spirometer in an upright position. 3. Breathe out normally. 4. Place the mouthpiece in your mouth and seal your lips tightly around it. 5. Breathe in slowly and as deeply as possible, raising the piston or the ball toward the top of the column. 6. Hold your breath for 3-5 seconds or for as long as possible. Allow the piston or ball to fall to the bottom of the column. 7. Remove the mouthpiece from your mouth and breathe out normally. 8. Rest for a few seconds and repeat Steps 1 through 7 at least 10 times every 1-2 hours when you are awake. Take your time and take a few normal breaths between deep breaths. 9. The spirometer may include an indicator to show your best effort. Use the indicator as a goal to work toward during each repetition. 10. After each set of 10 deep breaths, practice coughing to be sure your lungs are clear. If you have an incision (the cut made at the time of surgery), support your incision when coughing by placing a pillow or rolled up towels firmly against it. Once you are able to get out of bed, walk around indoors and cough well. You may stop using the incentive spirometer when instructed by your caregiver.  RISKS AND COMPLICATIONS  Take your time so you do not get dizzy or light-headed.  If you are in pain, you may need to take or ask for pain medication before doing incentive spirometry. It is harder  to take a deep breath if you are having pain. AFTER USE  Rest and breathe slowly and easily.  It can be helpful to keep track of a log of your progress. Your caregiver can provide you with a simple table to help with this. If you are using the spirometer at home, follow these instructions: SEEK MEDICAL CARE IF:   You are having difficultly using the spirometer.  You have trouble using the spirometer as often as instructed.  Your pain medication is not giving enough relief while using the spirometer.  You develop fever of 100.5 F (38.1 C) or higher. SEEK IMMEDIATE MEDICAL CARE IF:   You cough up bloody sputum that had not been present before.  You develop fever of 102 F (38.9 C) or greater.  You develop worsening pain at or near the incision site. MAKE SURE YOU:   Understand these instructions.  Will watch your condition.  Will get help right away if you are not doing well or get worse. Document Released: 11/07/2006 Document Revised: 09/19/2011 Document Reviewed: 01/08/2007 ExitCare Patient Information 2014 ExitCare, [place].   ________________________________________________________________________    WHAT IS A BLOOD TRANSFUSION? Blood Transfusion Information  A transfusion is the replacement of blood or some of its parts. Blood is made  up of multiple cells which provide different functions.  Red blood cells carry oxygen and are used for blood loss replacement.  White blood cells fight against infection.  Platelets control bleeding.  Plasma helps clot blood.  Other blood products are available for specialized needs, such as hemophilia or other clotting disorders. BEFORE THE TRANSFUSION  Who gives blood for transfusions?   Healthy volunteers who are fully evaluated to make sure their blood is safe. This is blood bank blood. Transfusion therapy is the safest it has ever been in the practice of medicine. Before blood is taken from a donor, a complete history is  taken to make sure that person has no history of diseases nor engages in risky social behavior (examples are intravenous drug use or sexual activity with multiple partners). The donor's travel history is screened to minimize risk of transmitting infections, such as malaria. The donated blood is tested for signs of infectious diseases, such as HIV and hepatitis. The blood is then tested to be sure it is compatible with you in order to minimize the chance of a transfusion reaction. If you or a relative donates blood, this is often done in anticipation of surgery and is not appropriate for emergency situations. It takes many days to process the donated blood. RISKS AND COMPLICATIONS Although transfusion therapy is very safe and saves many lives, the main dangers of transfusion include:   Getting an infectious disease.  Developing a transfusion reaction. This is an allergic reaction to something in the blood you were given. Every precaution is taken to prevent this. The decision to have a blood transfusion has been considered carefully by your caregiver before blood is given. Blood is not given unless the benefits outweigh the risks. AFTER THE TRANSFUSION  Right after receiving a blood transfusion, you will usually feel much better and more energetic. This is especially true if your red blood cells have gotten low (anemic). The transfusion raises the level of the red blood cells which carry oxygen, and this usually causes an energy increase.  The nurse administering the transfusion will monitor you carefully for complications. HOME CARE INSTRUCTIONS  No special instructions are needed after a transfusion. You may find your energy is better. Speak with your caregiver about any limitations on activity for underlying diseases you may have. SEEK MEDICAL CARE IF:   Your condition is not improving after your transfusion.  You develop redness or irritation at the intravenous (IV) site. SEEK IMMEDIATE  MEDICAL CARE IF:  Any of the following symptoms occur over the next 12 hours:  Shaking chills.  You have a temperature by mouth above 102 F (38.9 C), not controlled by medicine.  Chest, back, or muscle pain.  People around you feel you are not acting correctly or are confused.  Shortness of breath or difficulty breathing.  Dizziness and fainting.  You get a rash or develop hives.  You have a decrease in urine output.  Your urine turns a dark color or changes to pink, red, or brown. Any of the following symptoms occur over the next 10 days:  You have a temperature by mouth above 102 F (38.9 C), not controlled by medicine.  Shortness of breath.  Weakness after normal activity.  The white part of the eye turns yellow (jaundice).  You have a decrease in the amount of urine or are urinating less often.  Your urine turns a dark color or changes to pink, red, or brown. Document Released: 06/24/2000 Document Revised: 09/19/2011 Document  Reviewed: 02/11/2008 ExitCare Patient Information 2014 [place], [place].  _______________________________________________________________________

## 2020-02-24 ENCOUNTER — Other Ambulatory Visit (HOSPITAL_COMMUNITY)
Admission: RE | Admit: 2020-02-24 | Discharge: 2020-02-24 | Disposition: A | Discharge status: Home / Self Care | Payer: 59 | Source: Ambulatory Visit | Attending: Orthopedic Surgery | Admitting: Orthopedic Surgery

## 2020-02-24 ENCOUNTER — Encounter (HOSPITAL_COMMUNITY)
Admission: RE | Admit: 2020-02-24 | Discharge: 2020-02-24 | Disposition: A | Discharge status: Home / Self Care | Payer: 59 | Source: Ambulatory Visit | Attending: Orthopedic Surgery | Admitting: Orthopedic Surgery

## 2020-02-24 ENCOUNTER — Other Ambulatory Visit: Payer: Self-pay

## 2020-02-24 ENCOUNTER — Ambulatory Visit: Payer: Self-pay | Admitting: Student

## 2020-02-24 ENCOUNTER — Encounter (HOSPITAL_COMMUNITY): Payer: Self-pay

## 2020-02-24 DIAGNOSIS — Z01810 Encounter for preprocedural cardiovascular examination: Secondary | ICD-10-CM | POA: Diagnosis present

## 2020-02-24 DIAGNOSIS — R9431 Abnormal electrocardiogram [ECG] [EKG]: Secondary | ICD-10-CM | POA: Diagnosis not present

## 2020-02-24 DIAGNOSIS — Z20822 Contact with and (suspected) exposure to covid-19: Secondary | ICD-10-CM | POA: Insufficient documentation

## 2020-02-24 DIAGNOSIS — E119 Type 2 diabetes mellitus without complications: Secondary | ICD-10-CM | POA: Insufficient documentation

## 2020-02-24 DIAGNOSIS — Z01812 Encounter for preprocedural laboratory examination: Secondary | ICD-10-CM | POA: Insufficient documentation

## 2020-02-24 HISTORY — DX: Headache, unspecified: R51.9

## 2020-02-24 HISTORY — DX: Sleep apnea, unspecified: G47.30

## 2020-02-24 HISTORY — DX: Personal history of urinary calculi: Z87.442

## 2020-02-24 HISTORY — DX: Anemia, unspecified: D64.9

## 2020-02-24 HISTORY — DX: Unspecified osteoarthritis, unspecified site: M19.90

## 2020-02-24 LAB — CBC
HCT: 36.9 % (ref 36.0–46.0)
Hemoglobin: 11.7 g/dL — ABNORMAL LOW (ref 12.0–15.0)
MCH: 28.5 pg (ref 26.0–34.0)
MCHC: 31.7 g/dL (ref 30.0–36.0)
MCV: 90 fL (ref 80.0–100.0)
Platelets: 283 10*3/uL (ref 150–400)
RBC: 4.1 MIL/uL (ref 3.87–5.11)
RDW: 12.6 % (ref 11.5–15.5)
WBC: 5.2 10*3/uL (ref 4.0–10.5)
nRBC: 0 % (ref 0.0–0.2)

## 2020-02-24 LAB — URINALYSIS, ROUTINE W REFLEX MICROSCOPIC
Bacteria, UA: NONE SEEN
Bilirubin Urine: NEGATIVE
Glucose, UA: 150 mg/dL — AB
Hgb urine dipstick: NEGATIVE
Ketones, ur: NEGATIVE mg/dL
Nitrite: NEGATIVE
Protein, ur: NEGATIVE mg/dL
Specific Gravity, Urine: 1.012 (ref 1.005–1.030)
pH: 5 (ref 5.0–8.0)

## 2020-02-24 LAB — COMPREHENSIVE METABOLIC PANEL
ALT: 15 U/L (ref 0–44)
AST: 20 U/L (ref 15–41)
Albumin: 4 g/dL (ref 3.5–5.0)
Alkaline Phosphatase: 59 U/L (ref 38–126)
Anion gap: 10 (ref 5–15)
BUN: 20 mg/dL (ref 8–23)
CO2: 28 mmol/L (ref 22–32)
Calcium: 9.3 mg/dL (ref 8.9–10.3)
Chloride: 100 mmol/L (ref 98–111)
Creatinine, Ser: 1.11 mg/dL — ABNORMAL HIGH (ref 0.44–1.00)
GFR calc Af Amer: 58 mL/min — ABNORMAL LOW (ref >60)
GFR calc non Af Amer: 50 mL/min — ABNORMAL LOW (ref >60)
Glucose, Bld: 107 mg/dL — ABNORMAL HIGH (ref 70–99)
Potassium: 4.2 mmol/L (ref 3.5–5.1)
Sodium: 138 mmol/L (ref 135–145)
Total Bilirubin: 0.6 mg/dL (ref 0.3–1.2)
Total Protein: 8.2 g/dL — ABNORMAL HIGH (ref 6.5–8.1)

## 2020-02-24 LAB — PROTIME-INR
INR: 1.1 (ref 0.8–1.2)
Prothrombin Time: 13.3 seconds (ref 11.4–15.2)

## 2020-02-24 LAB — HEMOGLOBIN A1C
Hgb A1c MFr Bld: 7.1 % — ABNORMAL HIGH (ref 4.8–5.6)
Mean Plasma Glucose: 157.07 mg/dL

## 2020-02-24 LAB — SARS CORONAVIRUS 2 (TAT 6-24 HRS): SARS Coronavirus 2: NEGATIVE

## 2020-02-24 LAB — SURGICAL PCR SCREEN
MRSA, PCR: NEGATIVE
Staphylococcus aureus: NEGATIVE

## 2020-02-24 LAB — GLUCOSE, CAPILLARY: Glucose-Capillary: 104 mg/dL — ABNORMAL HIGH (ref 70–99)

## 2020-02-24 NOTE — H&P (Signed)
TOTAL KNEE REVISION ADMISSION H&P  Patient is being admitted for left revision total knee arthroplasty.  Subjective:  Chief Complaint:left knee pain.  HPI: Alexandria Gibbs, 74 y.o. female, has a history of pain and functional disability in the left knee(s) due to failed previous arthroplasty and patient has failed non-surgical conservative treatments for greater than 12 weeks to include NSAID's and/or analgesics and activity modification. The indications for the revision of the total knee arthroplasty are loosening of one or more components. Onset of symptoms was gradual starting 1 years ago with gradually worsening course since that time.  Prior procedures on the left knee(s) include arthroplasty.  Patient currently rates pain in the left knee(s) at 8 out of 10 with activity. There is worsening of pain with activity and weight bearing, pain that interferes with activities of daily living and pain with passive range of motion.  Patient has evidence of prosthetic loosening by imaging studies. This condition presents safety issues increasing the risk of falls.  There is no current active infection.  There are no problems to display for this patient.  Past Medical History:  Diagnosis Date  . Anemia   . Arthritis   . Diabetes mellitus without complication (HCC)   . GERD (gastroesophageal reflux disease)   . Headache   . History of kidney stones   . Hypertension   . Sleep apnea     Past Surgical History:  Procedure Laterality Date  . ANKLE SURGERY Left    ligament repair  . CHOLECYSTECTOMY    . EYE SURGERY     cyst removal  . HAND SURGERY Right   . REPLACEMENT TOTAL KNEE BILATERAL    . TONSILLECTOMY    . WISDOM TOOTH EXTRACTION      Current Outpatient Medications  Medication Sig Dispense Refill Last Dose  . acetaminophen (TYLENOL) 500 MG tablet Take 1,000 mg by mouth every 6 (six) hours as needed for moderate pain.     Marland Kitchen albuterol (VENTOLIN HFA) 108 (90 Base) MCG/ACT inhaler Inhale 2  puffs into the lungs every 6 (six) hours as needed for wheezing or shortness of breath.      . benazepril-hydrochlorthiazide (LOTENSIN HCT) 20-12.5 MG tablet Take 1 tablet by mouth daily.      . cetirizine (ZYRTEC) 10 MG tablet Take 10 mg by mouth daily.     . Exenatide ER (BYDUREON) 2 MG PEN Inject 2 mg into the skin once a week.  (Patient not taking: Reported on 02/10/2020)     . gabapentin (NEURONTIN) 100 MG capsule Take 100 mg by mouth daily.     Marland Kitchen ibuprofen (ADVIL,MOTRIN) 400 MG tablet Take 1 tablet (400 mg total) by mouth every 8 (eight) hours as needed. (Patient not taking: Reported on 02/10/2020) 30 tablet 0   . loratadine (CLARITIN) 10 MG tablet Take 1 tablet (10 mg total) by mouth daily. (Patient not taking: Reported on 02/10/2020) 15 tablet 0   . Magnesium 250 MG TABS Take 250 mg by mouth daily.     . metFORMIN (GLUCOPHAGE) 500 MG tablet Take 500 mg by mouth 2 (two) times daily with a meal. (Patient not taking: Reported on 02/10/2020)     . methocarbamol (ROBAXIN) 500 MG tablet Take 1 tablet (500 mg total) by mouth every 8 (eight) hours as needed for muscle spasms. (Patient not taking: Reported on 02/10/2020) 20 tablet 0   . montelukast (SINGULAIR) 10 MG tablet Take 10 mg by mouth daily as needed (allergies).      Marland Kitchen  pantoprazole (PROTONIX) 40 MG tablet Take 40 mg by mouth 2 (two) times daily.      . pravastatin (PRAVACHOL) 40 MG tablet Take 40 mg by mouth every evening.      . Prenatal 27-1 MG TABS Take 1 tablet by mouth daily. (Patient not taking: Reported on 02/10/2020)     . psyllium (METAMUCIL) 58.6 % powder Take 1 packet by mouth daily.     Marland Kitchen pyridOXINE (VITAMIN B-6) 100 MG tablet Take 100 mg by mouth daily.     Marland Kitchen tiZANidine (ZANAFLEX) 4 MG tablet Take 4 mg by mouth 3 (three) times daily as needed for muscle spasms.     . traMADol (ULTRAM) 50 MG tablet Take 50 mg by mouth every 6 (six) hours as needed for moderate pain or severe pain.     . TRULICITY 0.75 MG/0.5ML SOPN Inject 75 mg into the  skin every 7 (seven) days.     . vitamin B-12 (CYANOCOBALAMIN) 1000 MCG tablet Take 1,000 mcg by mouth daily.     . Vitamin D, Ergocalciferol, (DRISDOL) 1.25 MG (50000 UNIT) CAPS capsule Take 50,000 Units by mouth every 7 (seven) days.     Marland Kitchen XIGDUO XR 11-998 MG TB24 Take 1 tablet by mouth daily.      No current facility-administered medications for this visit.   Allergies  Allergen Reactions  . Peach [Prunus Persica] Swelling  . Sulfa Antibiotics Itching and Swelling    Social History   Tobacco Use  . Smoking status: Never Smoker  . Smokeless tobacco: Never Used  Substance Use Topics  . Alcohol use: Not Currently    No family history on file.    Review of Systems  Constitutional: Negative.   HENT: Negative.   Eyes: Negative.   Respiratory: Negative.   Cardiovascular: Negative.   Gastrointestinal: Negative.   Endocrine: Negative.   Genitourinary: Negative.   Musculoskeletal: Positive for arthralgias and myalgias.  Skin: Negative.   Allergic/Immunologic: Positive for environmental allergies.  Neurological: Negative.   Hematological: Negative.   Psychiatric/Behavioral: Negative.      Objective:  Physical Exam Constitutional:      Appearance: Normal appearance.  HENT:     Head: Normocephalic and atraumatic.     Right Ear: External ear normal.     Left Ear: External ear normal.  Eyes:     Pupils: Pupils are equal, round, and reactive to light.  Cardiovascular:     Rate and Rhythm: Regular rhythm.     Heart sounds: Normal heart sounds. No murmur heard.  No friction rub. No gallop.   Pulmonary:     Breath sounds: Normal breath sounds. No wheezing, rhonchi or rales.  Abdominal:     Palpations: Abdomen is soft.     Tenderness: There is no abdominal tenderness.  Genitourinary:    Comments: Deferred Musculoskeletal:     Cervical back: Normal range of motion.     Comments: Examination of the left knee reveals a well-healed anterior incision. She has swelling. There  is no warmth, erythema, or effusion. She has a varus deformity. She has tenderness to palpation over the proximal tibia. Range of motion is 0-100. She does have varus/valgus instability throughout her range of motion. She has painless logrolling of the hip.  Skin:    General: Skin is warm and dry.  Neurological:     Mental Status: She is alert and oriented to person, place, and time.  Psychiatric:        Mood and Affect: Mood  normal.     Vital signs in last 24 hours: @VSRANGES @  Labs:  Estimated body mass index is 36.61 kg/m as calculated from the following:   Height as of an earlier encounter on 02/24/20: 5\' 5"  (1.651 m).   Weight as of an earlier encounter on 02/24/20: 99.8 kg.  Imaging Review Plain radiographs demonstrate severe degenerative joint disease of the left knee(s). The overall alignment is mild varus.There is evidence of loosening of the tibial components. The bone quality appears to be adequate for age and reported activity level.     Assessment/Plan:  End stage arthritis, left knee(s) with failed previous arthroplasty.   The patient history, physical examination, clinical judgment of the provider and imaging studies are consistent with end stage degenerative joint disease of the left knee(s), previous total knee arthroplasty. Revision total knee arthroplasty is deemed medically necessary. The treatment options including medical management, injection therapy, arthroscopy and revision arthroplasty were discussed at length. The risks and benefits of revision total knee arthroplasty were presented and reviewed. The risks due to aseptic loosening, infection, stiffness, patella tracking problems, thromboembolic complications and other imponderables were discussed. The patient acknowledged the explanation, agreed to proceed with the plan and consent was signed. Patient is being admitted for inpatient treatment for surgery, pain control, PT, OT, prophylactic antibiotics, VTE  prophylaxis, progressive ambulation and ADL's and discharge planning.The patient is planning to be discharged home with her sister and will complete outpatient physical therapy at [facility].

## 2020-02-24 NOTE — H&P (View-Only) (Signed)
TOTAL KNEE REVISION ADMISSION H&P  Patient is being admitted for left revision total knee arthroplasty.  Subjective:  Chief Complaint:left knee pain.  HPI: Alexandria Gibbs, 74 y.o. female, has a history of pain and functional disability in the left knee(s) due to failed previous arthroplasty and patient has failed non-surgical conservative treatments for greater than 12 weeks to include NSAID's and/or analgesics and activity modification. The indications for the revision of the total knee arthroplasty are loosening of one or more components. Onset of symptoms was gradual starting 1 years ago with gradually worsening course since that time.  Prior procedures on the left knee(s) include arthroplasty.  Patient currently rates pain in the left knee(s) at 8 out of 10 with activity. There is worsening of pain with activity and weight bearing, pain that interferes with activities of daily living and pain with passive range of motion.  Patient has evidence of prosthetic loosening by imaging studies. This condition presents safety issues increasing the risk of falls.  There is no current active infection.  There are no problems to display for this patient.  Past Medical History:  Diagnosis Date  . Anemia   . Arthritis   . Diabetes mellitus without complication (HCC)   . GERD (gastroesophageal reflux disease)   . Headache   . History of kidney stones   . Hypertension   . Sleep apnea     Past Surgical History:  Procedure Laterality Date  . ANKLE SURGERY Left    ligament repair  . CHOLECYSTECTOMY    . EYE SURGERY     cyst removal  . HAND SURGERY Right   . REPLACEMENT TOTAL KNEE BILATERAL    . TONSILLECTOMY    . WISDOM TOOTH EXTRACTION      Current Outpatient Medications  Medication Sig Dispense Refill Last Dose  . acetaminophen (TYLENOL) 500 MG tablet Take 1,000 mg by mouth every 6 (six) hours as needed for moderate pain.     Marland Kitchen albuterol (VENTOLIN HFA) 108 (90 Base) MCG/ACT inhaler Inhale 2  puffs into the lungs every 6 (six) hours as needed for wheezing or shortness of breath.      . benazepril-hydrochlorthiazide (LOTENSIN HCT) 20-12.5 MG tablet Take 1 tablet by mouth daily.      . cetirizine (ZYRTEC) 10 MG tablet Take 10 mg by mouth daily.     . Exenatide ER (BYDUREON) 2 MG PEN Inject 2 mg into the skin once a week.  (Patient not taking: Reported on 02/10/2020)     . gabapentin (NEURONTIN) 100 MG capsule Take 100 mg by mouth daily.     Marland Kitchen ibuprofen (ADVIL,MOTRIN) 400 MG tablet Take 1 tablet (400 mg total) by mouth every 8 (eight) hours as needed. (Patient not taking: Reported on 02/10/2020) 30 tablet 0   . loratadine (CLARITIN) 10 MG tablet Take 1 tablet (10 mg total) by mouth daily. (Patient not taking: Reported on 02/10/2020) 15 tablet 0   . Magnesium 250 MG TABS Take 250 mg by mouth daily.     . metFORMIN (GLUCOPHAGE) 500 MG tablet Take 500 mg by mouth 2 (two) times daily with a meal. (Patient not taking: Reported on 02/10/2020)     . methocarbamol (ROBAXIN) 500 MG tablet Take 1 tablet (500 mg total) by mouth every 8 (eight) hours as needed for muscle spasms. (Patient not taking: Reported on 02/10/2020) 20 tablet 0   . montelukast (SINGULAIR) 10 MG tablet Take 10 mg by mouth daily as needed (allergies).      Marland Kitchen  pantoprazole (PROTONIX) 40 MG tablet Take 40 mg by mouth 2 (two) times daily.      . pravastatin (PRAVACHOL) 40 MG tablet Take 40 mg by mouth every evening.      . Prenatal 27-1 MG TABS Take 1 tablet by mouth daily. (Patient not taking: Reported on 02/10/2020)     . psyllium (METAMUCIL) 58.6 % powder Take 1 packet by mouth daily.     Marland Kitchen pyridOXINE (VITAMIN B-6) 100 MG tablet Take 100 mg by mouth daily.     Marland Kitchen tiZANidine (ZANAFLEX) 4 MG tablet Take 4 mg by mouth 3 (three) times daily as needed for muscle spasms.     . traMADol (ULTRAM) 50 MG tablet Take 50 mg by mouth every 6 (six) hours as needed for moderate pain or severe pain.     . TRULICITY 0.75 MG/0.5ML SOPN Inject 75 mg into the  skin every 7 (seven) days.     . vitamin B-12 (CYANOCOBALAMIN) 1000 MCG tablet Take 1,000 mcg by mouth daily.     . Vitamin D, Ergocalciferol, (DRISDOL) 1.25 MG (50000 UNIT) CAPS capsule Take 50,000 Units by mouth every 7 (seven) days.     Marland Kitchen XIGDUO XR 11-998 MG TB24 Take 1 tablet by mouth daily.      No current facility-administered medications for this visit.   Allergies  Allergen Reactions  . Peach [Prunus Persica] Swelling  . Sulfa Antibiotics Itching and Swelling    Social History   Tobacco Use  . Smoking status: Never Smoker  . Smokeless tobacco: Never Used  Substance Use Topics  . Alcohol use: Not Currently    No family history on file.    Review of Systems  Constitutional: Negative.   HENT: Negative.   Eyes: Negative.   Respiratory: Negative.   Cardiovascular: Negative.   Gastrointestinal: Negative.   Endocrine: Negative.   Genitourinary: Negative.   Musculoskeletal: Positive for arthralgias and myalgias.  Skin: Negative.   Allergic/Immunologic: Positive for environmental allergies.  Neurological: Negative.   Hematological: Negative.   Psychiatric/Behavioral: Negative.      Objective:  Physical Exam Constitutional:      Appearance: Normal appearance.  HENT:     Head: Normocephalic and atraumatic.     Right Ear: External ear normal.     Left Ear: External ear normal.  Eyes:     Pupils: Pupils are equal, round, and reactive to light.  Cardiovascular:     Rate and Rhythm: Regular rhythm.     Heart sounds: Normal heart sounds. No murmur heard.  No friction rub. No gallop.   Pulmonary:     Breath sounds: Normal breath sounds. No wheezing, rhonchi or rales.  Abdominal:     Palpations: Abdomen is soft.     Tenderness: There is no abdominal tenderness.  Genitourinary:    Comments: Deferred Musculoskeletal:     Cervical back: Normal range of motion.     Comments: Examination of the left knee reveals a well-healed anterior incision. She has swelling. There  is no warmth, erythema, or effusion. She has a varus deformity. She has tenderness to palpation over the proximal tibia. Range of motion is 0-100. She does have varus/valgus instability throughout her range of motion. She has painless logrolling of the hip.  Skin:    General: Skin is warm and dry.  Neurological:     Mental Status: She is alert and oriented to person, place, and time.  Psychiatric:        Mood and Affect: Mood  normal.     Vital signs in last 24 hours: @VSRANGES@  Labs:  Estimated body mass index is 36.61 kg/m as calculated from the following:   Height as of an earlier encounter on 02/24/20: 5' 5" (1.651 m).   Weight as of an earlier encounter on 02/24/20: 99.8 kg.  Imaging Review Plain radiographs demonstrate severe degenerative joint disease of the left knee(s). The overall alignment is mild varus.There is evidence of loosening of the tibial components. The bone quality appears to be adequate for age and reported activity level.     Assessment/Plan:  End stage arthritis, left knee(s) with failed previous arthroplasty.   The patient history, physical examination, clinical judgment of the provider and imaging studies are consistent with end stage degenerative joint disease of the left knee(s), previous total knee arthroplasty. Revision total knee arthroplasty is deemed medically necessary. The treatment options including medical management, injection therapy, arthroscopy and revision arthroplasty were discussed at length. The risks and benefits of revision total knee arthroplasty were presented and reviewed. The risks due to aseptic loosening, infection, stiffness, patella tracking problems, thromboembolic complications and other imponderables were discussed. The patient acknowledged the explanation, agreed to proceed with the plan and consent was signed. Patient is being admitted for inpatient treatment for surgery, pain control, PT, OT, prophylactic antibiotics, VTE  prophylaxis, progressive ambulation and ADL's and discharge planning.The patient is planning to be discharged home with her sister and will complete outpatient physical therapy at [facility].  

## 2020-02-25 NOTE — Progress Notes (Signed)
A1c results sent to Dr. Linna Caprice to review.

## 2020-02-26 ENCOUNTER — Encounter (HOSPITAL_COMMUNITY): Payer: Self-pay | Admitting: Orthopedic Surgery

## 2020-02-26 NOTE — Anesthesia Preprocedure Evaluation (Addendum)
Anesthesia Evaluation  Patient identified by MRN, date of birth, ID band Patient awake    Reviewed: Allergy & Precautions, NPO status , Patient's Chart, lab work & pertinent test results  Airway Mallampati: II  TM Distance: >3 FB     Dental  (+) Caps,    Pulmonary sleep apnea ,    Pulmonary exam normal breath sounds clear to auscultation       Cardiovascular Exercise Tolerance: Good hypertension, Normal cardiovascular exam Rhythm:Regular Rate:Normal     Neuro/Psych  Headaches, negative psych ROS   GI/Hepatic GERD  ,  Endo/Other  diabetes  Renal/GU      Musculoskeletal  (+) Arthritis , Osteoarthritis,    Abdominal Normal abdominal exam  (+)   Peds  Hematology  (+) anemia ,   Anesthesia Other Findings   Reproductive/Obstetrics                            Anesthesia Physical Anesthesia Plan  ASA: III  Anesthesia Plan: Spinal, Regional and MAC   Post-op Pain Management:  Regional for Post-op pain   Induction:   PONV Risk Score and Plan: 2 and Propofol infusion, TIVA, Treatment may vary due to age or medical condition and Midazolam  Airway Management Planned: Natural Airway and Simple Face Mask  Additional Equipment:   Intra-op Plan:   Post-operative Plan:   Informed Consent: I have reviewed the patients History and Physical, chart, labs and discussed the procedure including the risks, benefits and alternatives for the proposed anesthesia with the patient or authorized representative who has indicated his/her understanding and acceptance.     Dental advisory given  Plan Discussed with:   Anesthesia Plan Comments: (Adductor canal block + Spinal + propofol gtt)       Anesthesia Quick Evaluation

## 2020-02-27 ENCOUNTER — Encounter (HOSPITAL_COMMUNITY)
Admission: RE | Disposition: A | Discharge status: Home / Self Care | Payer: Self-pay | Source: Home / Self Care | Attending: Orthopedic Surgery

## 2020-02-27 ENCOUNTER — Ambulatory Visit (HOSPITAL_COMMUNITY): Payer: 59

## 2020-02-27 ENCOUNTER — Observation Stay (HOSPITAL_COMMUNITY)
Admission: RE | Admit: 2020-02-27 | Discharge: 2020-03-01 | Disposition: A | Discharge status: Home / Self Care | Payer: 59 | Attending: Orthopedic Surgery | Admitting: Orthopedic Surgery

## 2020-02-27 ENCOUNTER — Other Ambulatory Visit: Payer: Self-pay

## 2020-02-27 ENCOUNTER — Ambulatory Visit (HOSPITAL_COMMUNITY): Payer: 59 | Admitting: Physician Assistant

## 2020-02-27 ENCOUNTER — Encounter (HOSPITAL_COMMUNITY): Payer: Self-pay | Admitting: Orthopedic Surgery

## 2020-02-27 ENCOUNTER — Ambulatory Visit (HOSPITAL_COMMUNITY): Payer: 59 | Admitting: Certified Registered"

## 2020-02-27 DIAGNOSIS — R262 Difficulty in walking, not elsewhere classified: Secondary | ICD-10-CM | POA: Insufficient documentation

## 2020-02-27 DIAGNOSIS — Y838 Other surgical procedures as the cause of abnormal reaction of the patient, or of later complication, without mention of misadventure at the time of the procedure: Secondary | ICD-10-CM | POA: Insufficient documentation

## 2020-02-27 DIAGNOSIS — T84093A Other mechanical complication of internal left knee prosthesis, initial encounter: Principal | ICD-10-CM

## 2020-02-27 DIAGNOSIS — E119 Type 2 diabetes mellitus without complications: Secondary | ICD-10-CM | POA: Insufficient documentation

## 2020-02-27 DIAGNOSIS — I10 Essential (primary) hypertension: Secondary | ICD-10-CM | POA: Insufficient documentation

## 2020-02-27 DIAGNOSIS — Z96652 Presence of left artificial knee joint: Secondary | ICD-10-CM

## 2020-02-27 DIAGNOSIS — Z79899 Other long term (current) drug therapy: Secondary | ICD-10-CM | POA: Diagnosis not present

## 2020-02-27 DIAGNOSIS — Y999 Unspecified external cause status: Secondary | ICD-10-CM | POA: Diagnosis not present

## 2020-02-27 HISTORY — PX: TOTAL KNEE REVISION: SHX996

## 2020-02-27 LAB — GLUCOSE, CAPILLARY
Glucose-Capillary: 153 mg/dL — ABNORMAL HIGH (ref 70–99)
Glucose-Capillary: 194 mg/dL — ABNORMAL HIGH (ref 70–99)
Glucose-Capillary: 203 mg/dL — ABNORMAL HIGH (ref 70–99)
Glucose-Capillary: 236 mg/dL — ABNORMAL HIGH (ref 70–99)
Glucose-Capillary: 230 mg/dL — ABNORMAL HIGH (ref 70–99)

## 2020-02-27 LAB — TYPE AND SCREEN
ABO/RH(D): B POS
Antibody Screen: NEGATIVE

## 2020-02-27 LAB — ABO/RH: ABO/RH(D): B POS

## 2020-02-27 SURGERY — TOTAL KNEE REVISION
Anesthesia: Monitor Anesthesia Care | Site: Knee | Laterality: Left

## 2020-02-27 MED ORDER — FENTANYL CITRATE (PF) 100 MCG/2ML IJ SOLN
INTRAMUSCULAR | Status: DC | PRN
  Administered 2020-02-27: 50 ug via INTRAVENOUS
  Administered 2020-02-27 (×4): 25 ug via INTRAVENOUS
  Administered 2020-02-27: 50 ug via INTRAVENOUS
  Administered 2020-02-27: 25 ug via INTRAVENOUS

## 2020-02-27 MED ORDER — HYDROMORPHONE HCL 1 MG/ML IJ SOLN
0.2500 mg | INTRAMUSCULAR | Status: DC | PRN
  Administered 2020-02-27 (×2): 0.5 mg via INTRAVENOUS

## 2020-02-27 MED ORDER — PROPOFOL 10 MG/ML IV BOLUS
INTRAVENOUS | Status: DC | PRN
  Administered 2020-02-27: 20 mg via INTRAVENOUS

## 2020-02-27 MED ORDER — ALUM & MAG HYDROXIDE-SIMETH 200-200-20 MG/5ML PO SUSP: 30.0000 mL | ORAL | Status: DC | PRN

## 2020-02-27 MED ORDER — DIPHENHYDRAMINE HCL 12.5 MG/5ML PO ELIX
12.5000 mg | ORAL_SOLUTION | ORAL | Status: DC | PRN
  Administered 2020-02-27 (×2): 25 mg via ORAL
  Administered 2020-02-29: 12.5 mg via ORAL
  Filled 2020-02-27: qty 10
  Filled 2020-02-27: qty 5
  Filled 2020-02-27: qty 10

## 2020-02-27 MED ORDER — 0.9 % SODIUM CHLORIDE (POUR BTL) OPTIME
TOPICAL | Status: DC | PRN
  Administered 2020-02-27: 1000 mL

## 2020-02-27 MED ORDER — DEXAMETHASONE SODIUM PHOSPHATE 10 MG/ML IJ SOLN
INTRAMUSCULAR | Status: AC
  Filled 2020-02-27: qty 1

## 2020-02-27 MED ORDER — HYDROCODONE-ACETAMINOPHEN 5-325 MG PO TABS
1.0000 | ORAL_TABLET | ORAL | Status: DC | PRN
  Administered 2020-02-28: 2 via ORAL
  Administered 2020-02-28 (×2): 1 via ORAL
  Filled 2020-02-27: qty 2
  Filled 2020-02-27 (×2): qty 1

## 2020-02-27 MED ORDER — HYDROCODONE-ACETAMINOPHEN 7.5-325 MG PO TABS
1.0000 | ORAL_TABLET | ORAL | Status: DC | PRN
  Administered 2020-02-27 – 2020-02-28 (×3): 2 via ORAL
  Administered 2020-02-28: 1 via ORAL
  Administered 2020-02-28 – 2020-02-29 (×3): 2 via ORAL
  Administered 2020-02-29 (×2): 1 via ORAL
  Administered 2020-03-01 (×3): 2 via ORAL
  Administered 2020-03-01: 1 via ORAL
  Filled 2020-02-27 (×3): qty 2
  Filled 2020-02-27 (×2): qty 1
  Filled 2020-02-27 (×7): qty 2
  Filled 2020-02-27: qty 1

## 2020-02-27 MED ORDER — METOCLOPRAMIDE HCL 5 MG/ML IJ SOLN: 5.0000 mg | Freq: Three times a day (TID) | INTRAMUSCULAR | Status: DC | PRN

## 2020-02-27 MED ORDER — ONDANSETRON HCL 4 MG/2ML IJ SOLN
INTRAMUSCULAR | Status: AC
  Filled 2020-02-27: qty 2

## 2020-02-27 MED ORDER — SODIUM CHLORIDE (PF) 0.9 % IJ SOLN
INTRAMUSCULAR | Status: AC
  Filled 2020-02-27: qty 50

## 2020-02-27 MED ORDER — PROPOFOL 10 MG/ML IV BOLUS
INTRAVENOUS | Status: AC
  Filled 2020-02-27: qty 20

## 2020-02-27 MED ORDER — FENTANYL CITRATE (PF) 100 MCG/2ML IJ SOLN
INTRAMUSCULAR | Status: AC
  Filled 2020-02-27: qty 2

## 2020-02-27 MED ORDER — BUPIVACAINE IN DEXTROSE 0.75-8.25 % IT SOLN
INTRATHECAL | Status: DC | PRN
  Administered 2020-02-27: 2 mL via INTRATHECAL

## 2020-02-27 MED ORDER — PROPOFOL 500 MG/50ML IV EMUL
INTRAVENOUS | Status: AC
  Filled 2020-02-27: qty 50

## 2020-02-27 MED ORDER — PHENOL 1.4 % MT LIQD: 1.0000 | OROMUCOSAL | Status: DC | PRN

## 2020-02-27 MED ORDER — BUPIVACAINE-EPINEPHRINE 0.25% -1:200000 IJ SOLN
INTRAMUSCULAR | Status: DC | PRN
  Administered 2020-02-27: 30 mL

## 2020-02-27 MED ORDER — POVIDONE-IODINE 10 % EX SWAB: 2.0000 "application " | Freq: Once | CUTANEOUS | Status: AC

## 2020-02-27 MED ORDER — TRANEXAMIC ACID-NACL 1000-0.7 MG/100ML-% IV SOLN
1000.0000 mg | INTRAVENOUS | Status: AC
  Administered 2020-02-27: 1000 mg via INTRAVENOUS
  Filled 2020-02-27: qty 100

## 2020-02-27 MED ORDER — HYDROMORPHONE HCL 1 MG/ML IJ SOLN
INTRAMUSCULAR | Status: AC
  Filled 2020-02-27: qty 1

## 2020-02-27 MED ORDER — ACETAMINOPHEN 325 MG PO TABS: 325.0000 mg | ORAL_TABLET | Freq: Four times a day (QID) | ORAL | Status: DC | PRN

## 2020-02-27 MED ORDER — CHLORHEXIDINE GLUCONATE 0.12 % MT SOLN
15.0000 mL | Freq: Once | OROMUCOSAL | Status: AC
  Administered 2020-02-27: 15 mL via OROMUCOSAL

## 2020-02-27 MED ORDER — SODIUM CHLORIDE 0.9 % IV SOLN: INTRAVENOUS | Status: DC

## 2020-02-27 MED ORDER — MIDAZOLAM HCL 2 MG/2ML IJ SOLN
INTRAMUSCULAR | Status: DC | PRN
  Administered 2020-02-27 (×2): .5 mg via INTRAVENOUS
  Administered 2020-02-27: 1 mg via INTRAVENOUS
  Administered 2020-02-27 (×2): .5 mg via INTRAVENOUS
  Administered 2020-02-27: 1 mg via INTRAVENOUS

## 2020-02-27 MED ORDER — BUPIVACAINE-EPINEPHRINE (PF) 0.5% -1:200000 IJ SOLN
INTRAMUSCULAR | Status: DC | PRN
  Administered 2020-02-27: 30 mL via PERINEURAL

## 2020-02-27 MED ORDER — DEXAMETHASONE SODIUM PHOSPHATE 10 MG/ML IJ SOLN
10.0000 mg | Freq: Once | INTRAMUSCULAR | Status: AC
  Administered 2020-02-28: 10 mg via INTRAVENOUS
  Filled 2020-02-27: qty 1

## 2020-02-27 MED ORDER — CEFAZOLIN SODIUM-DEXTROSE 2-4 GM/100ML-% IV SOLN
2.0000 g | INTRAVENOUS | Status: AC
  Administered 2020-02-27: 2 g via INTRAVENOUS
  Filled 2020-02-27: qty 100

## 2020-02-27 MED ORDER — ONDANSETRON HCL 4 MG PO TABS: 4.0000 mg | ORAL_TABLET | Freq: Four times a day (QID) | ORAL | Status: DC | PRN

## 2020-02-27 MED ORDER — PROPOFOL 500 MG/50ML IV EMUL
INTRAVENOUS | Status: DC | PRN
  Administered 2020-02-27: 75 ug/kg/min via INTRAVENOUS

## 2020-02-27 MED ORDER — PHENYLEPHRINE HCL (PRESSORS) 10 MG/ML IV SOLN
INTRAVENOUS | Status: AC
  Filled 2020-02-27: qty 1

## 2020-02-27 MED ORDER — BUPIVACAINE-EPINEPHRINE (PF) 0.25% -1:200000 IJ SOLN
INTRAMUSCULAR | Status: AC
  Filled 2020-02-27: qty 30

## 2020-02-27 MED ORDER — PROMETHAZINE HCL 25 MG/ML IJ SOLN: 6.2500 mg | INTRAMUSCULAR | Status: DC | PRN

## 2020-02-27 MED ORDER — KETOROLAC TROMETHAMINE 30 MG/ML IJ SOLN
INTRAMUSCULAR | Status: DC | PRN
  Administered 2020-02-27: 30 mg via INTRAVENOUS

## 2020-02-27 MED ORDER — ACETAMINOPHEN 10 MG/ML IV SOLN
1000.0000 mg | Freq: Once | INTRAVENOUS | Status: AC
  Administered 2020-02-27: 1000 mg via INTRAVENOUS
  Filled 2020-02-27: qty 100

## 2020-02-27 MED ORDER — SODIUM CHLORIDE (PF) 0.9 % IJ SOLN
INTRAMUSCULAR | Status: DC | PRN
  Administered 2020-02-27: 30 mL via INTRAVENOUS

## 2020-02-27 MED ORDER — METHOCARBAMOL 1000 MG/10ML IJ SOLN
500.0000 mg | Freq: Four times a day (QID) | INTRAVENOUS | Status: DC | PRN
  Filled 2020-02-27: qty 5

## 2020-02-27 MED ORDER — ISOPROPYL ALCOHOL 70 % SOLN
Status: DC | PRN
  Administered 2020-02-27: 1 via TOPICAL

## 2020-02-27 MED ORDER — ORAL CARE MOUTH RINSE: 15.0000 mL | Freq: Once | OROMUCOSAL | Status: AC

## 2020-02-27 MED ORDER — POVIDONE-IODINE 10 % EX SWAB
2.0000 "application " | Freq: Once | CUTANEOUS | Status: AC
  Administered 2020-02-27: 2 via TOPICAL

## 2020-02-27 MED ORDER — LIDOCAINE 2% (20 MG/ML) 5 ML SYRINGE
INTRAMUSCULAR | Status: AC
  Filled 2020-02-27: qty 5

## 2020-02-27 MED ORDER — KETOROLAC TROMETHAMINE 30 MG/ML IJ SOLN
INTRAMUSCULAR | Status: AC
  Filled 2020-02-27: qty 1

## 2020-02-27 MED ORDER — LACTATED RINGERS IV SOLN: INTRAVENOUS | Status: DC

## 2020-02-27 MED ORDER — DOCUSATE SODIUM 100 MG PO CAPS
100.0000 mg | ORAL_CAPSULE | Freq: Two times a day (BID) | ORAL | Status: DC
  Administered 2020-02-27 – 2020-03-01 (×6): 100 mg via ORAL
  Filled 2020-02-27 (×6): qty 1

## 2020-02-27 MED ORDER — PROPOFOL 1000 MG/100ML IV EMUL
INTRAVENOUS | Status: AC
  Filled 2020-02-27: qty 200

## 2020-02-27 MED ORDER — LIDOCAINE 2% (20 MG/ML) 5 ML SYRINGE
INTRAMUSCULAR | Status: DC | PRN
  Administered 2020-02-27: 40 mg via INTRAVENOUS

## 2020-02-27 MED ORDER — METHOCARBAMOL 500 MG PO TABS
500.0000 mg | ORAL_TABLET | Freq: Four times a day (QID) | ORAL | Status: DC | PRN
  Administered 2020-02-27 – 2020-03-01 (×6): 500 mg via ORAL
  Filled 2020-02-27 (×6): qty 1

## 2020-02-27 MED ORDER — POLYETHYLENE GLYCOL 3350 17 G PO PACK: 17.0000 g | PACK | Freq: Every day | ORAL | Status: DC | PRN

## 2020-02-27 MED ORDER — ONDANSETRON HCL 4 MG/2ML IJ SOLN: 4.0000 mg | Freq: Four times a day (QID) | INTRAMUSCULAR | Status: DC | PRN

## 2020-02-27 MED ORDER — ONDANSETRON HCL 4 MG/2ML IJ SOLN
INTRAMUSCULAR | Status: DC | PRN
  Administered 2020-02-27: 4 mg via INTRAVENOUS

## 2020-02-27 MED ORDER — CEFAZOLIN SODIUM-DEXTROSE 2-4 GM/100ML-% IV SOLN
2.0000 g | Freq: Four times a day (QID) | INTRAVENOUS | Status: AC
  Administered 2020-02-27 (×2): 2 g via INTRAVENOUS
  Filled 2020-02-27 (×2): qty 100

## 2020-02-27 MED ORDER — MENTHOL 3 MG MT LOZG: 1.0000 | LOZENGE | OROMUCOSAL | Status: DC | PRN

## 2020-02-27 MED ORDER — SODIUM CHLORIDE 0.9 % IR SOLN
Status: DC | PRN
  Administered 2020-02-27: 3000 mL

## 2020-02-27 MED ORDER — MORPHINE SULFATE (PF) 2 MG/ML IV SOLN
0.5000 mg | INTRAVENOUS | Status: DC | PRN
  Administered 2020-02-27: 0.5 mg via INTRAVENOUS
  Filled 2020-02-27: qty 1

## 2020-02-27 MED ORDER — MIDAZOLAM HCL 2 MG/2ML IJ SOLN
INTRAMUSCULAR | Status: AC
  Filled 2020-02-27: qty 2

## 2020-02-27 MED ORDER — HYDROMORPHONE HCL 1 MG/ML IJ SOLN
INTRAMUSCULAR | Status: AC
Start: 2020-02-27 — End: 2020-02-28
  Filled 2020-02-27: qty 1

## 2020-02-27 MED ORDER — FENTANYL CITRATE (PF) 100 MCG/2ML IJ SOLN
INTRAMUSCULAR | Status: AC
Start: 2020-02-27 — End: ?
  Filled 2020-02-27: qty 2

## 2020-02-27 MED ORDER — STERILE WATER FOR IRRIGATION IR SOLN
Status: DC | PRN
  Administered 2020-02-27: 2000 mL

## 2020-02-27 MED ORDER — DEXAMETHASONE SODIUM PHOSPHATE 10 MG/ML IJ SOLN
INTRAMUSCULAR | Status: DC | PRN
  Administered 2020-02-27: 4 mg via INTRAVENOUS

## 2020-02-27 MED ORDER — METOCLOPRAMIDE HCL 5 MG PO TABS: 5.0000 mg | ORAL_TABLET | Freq: Three times a day (TID) | ORAL | Status: DC | PRN

## 2020-02-27 MED ORDER — ISOPROPYL ALCOHOL 70 % SOLN
Status: AC
  Filled 2020-02-27: qty 480

## 2020-02-27 MED ORDER — PROPOFOL 1000 MG/100ML IV EMUL
INTRAVENOUS | Status: AC
  Filled 2020-02-27: qty 100

## 2020-02-27 MED ORDER — APIXABAN 2.5 MG PO TABS
2.5000 mg | ORAL_TABLET | Freq: Two times a day (BID) | ORAL | Status: DC
  Administered 2020-02-28 – 2020-03-01 (×5): 2.5 mg via ORAL
  Filled 2020-02-27 (×5): qty 1

## 2020-02-27 SURGICAL SUPPLY — 88 items
AUGMENT FEM TRAB NEXG SZ30 (Joint) ×2 IMPLANT
AUGMENT FEM VGD 65X15 RT (Joint) ×2 IMPLANT
AUGMENT POSTERIOR W/BOLT 10/65 (Miscellaneous) ×1 IMPLANT
BAG DECANTER FOR FLEXI CONT (MISCELLANEOUS) IMPLANT
BAG ZIPLOCK 12X15 (MISCELLANEOUS) ×2 IMPLANT
BEARING TIBIAL VG 71X75X14 (Joint) ×1 IMPLANT
BLADE SAW RECIPROCATING 77.5 (BLADE) ×2 IMPLANT
BLADE SAW SGTL 81X20 HD (BLADE) ×2 IMPLANT
BLADE SURG SZ10 CARB STEEL (BLADE) ×2 IMPLANT
BNDG ELASTIC 6X5.8 VLCR STR LF (GAUZE/BANDAGES/DRESSINGS) ×4 IMPLANT
CANISTER WOUNDNEG PRESSURE 500 (CANNISTER) ×2 IMPLANT
CEMENT BONE REFOBACIN R1X40 US (Cement) ×8 IMPLANT
CHLORAPREP W/TINT 26 (MISCELLANEOUS) ×4 IMPLANT
COMPONENT FEM 65 LT W/SCRW (Miscellaneous) ×2 IMPLANT
CONE TIBIAL MED 31 KNEE (Miscellaneous) ×1 IMPLANT
COVER SURGICAL LIGHT HANDLE (MISCELLANEOUS) ×2 IMPLANT
COVER WAND RF STERILE (DRAPES) IMPLANT
CUFF TOURN SGL QUICK 34 (TOURNIQUET CUFF) ×1
CUFF TRNQT CYL 34X4.125X (TOURNIQUET CUFF) ×1 IMPLANT
DECANTER SPIKE VIAL GLASS SM (MISCELLANEOUS) IMPLANT
DERMABOND ADVANCED (GAUZE/BANDAGES/DRESSINGS)
DERMABOND ADVANCED .7 DNX12 (GAUZE/BANDAGES/DRESSINGS) IMPLANT
DRAPE SHEET LG 3/4 BI-LAMINATE (DRAPES) ×6 IMPLANT
DRAPE U-SHAPE 47X51 STRL (DRAPES) ×2 IMPLANT
DRESSING PREVENA PLUS CUSTOM (GAUZE/BANDAGES/DRESSINGS) ×1 IMPLANT
DRSG AQUACEL AG ADV 3.5X10 (GAUZE/BANDAGES/DRESSINGS) IMPLANT
DRSG PREVENA PLUS CUSTOM (GAUZE/BANDAGES/DRESSINGS) ×2
DRSG TEGADERM 4X4.75 (GAUZE/BANDAGES/DRESSINGS) ×2 IMPLANT
ELECT BLADE TIP CTD 4 INCH (ELECTRODE) ×2 IMPLANT
ELECT REM PT RETURN 15FT ADLT (MISCELLANEOUS) ×2 IMPLANT
EVACUATOR 1/8 PVC DRAIN (DRAIN) ×2 IMPLANT
GLOVE BIO SURGEON STRL SZ8.5 (GLOVE) ×4 IMPLANT
GLOVE BIOGEL PI IND STRL 7.0 (GLOVE) ×1 IMPLANT
GLOVE BIOGEL PI IND STRL 7.5 (GLOVE) ×1 IMPLANT
GLOVE BIOGEL PI IND STRL 8.5 (GLOVE) ×1 IMPLANT
GLOVE BIOGEL PI INDICATOR 7.0 (GLOVE) ×1
GLOVE BIOGEL PI INDICATOR 7.5 (GLOVE) ×1
GLOVE BIOGEL PI INDICATOR 8.5 (GLOVE) ×1
GOWN SPEC L3 XXLG W/TWL (GOWN DISPOSABLE) ×2 IMPLANT
GOWN STRL REUS W/TWL XL LVL3 (GOWN DISPOSABLE) ×2 IMPLANT
HANDPIECE INTERPULSE COAX TIP (DISPOSABLE) ×1
HDLS TROCR DRIL PIN KNEE 75 (PIN) ×1
HOLDER FOLEY CATH W/STRAP (MISCELLANEOUS) IMPLANT
HOOD PEEL AWAY FLYTE STAYCOOL (MISCELLANEOUS) ×8 IMPLANT
JET LAVAGE IRRISEPT WOUND (IRRIGATION / IRRIGATOR) ×2
KIT DRSG PREVENA PLUS 7DAY 125 (MISCELLANEOUS) ×2 IMPLANT
KIT TURNOVER KIT A (KITS) IMPLANT
LAVAGE JET IRRISEPT WOUND (IRRIGATION / IRRIGATOR) ×1 IMPLANT
MANIFOLD NEPTUNE II (INSTRUMENTS) ×2 IMPLANT
MARKER SKIN DUAL TIP RULER LAB (MISCELLANEOUS) ×2 IMPLANT
NEEDLE SPNL 18GX3.5 QUINCKE PK (NEEDLE) ×2 IMPLANT
NS IRRIG 1000ML POUR BTL (IV SOLUTION) ×2 IMPLANT
OSTEOTOME THIN 8 3 (MISCELLANEOUS) ×2 IMPLANT
PACK TOTAL KNEE CUSTOM (KITS) ×2 IMPLANT
PADDING CAST COTTON 6X4 STRL (CAST SUPPLIES) ×4 IMPLANT
PENCIL SMOKE EVACUATOR (MISCELLANEOUS) IMPLANT
PIN DRILL HDLS TROCAR 75 4PK (PIN) ×1 IMPLANT
POSTERIOR AUG W/BOLT 10/65 (Miscellaneous) ×2 IMPLANT
PROTECTOR NERVE ULNAR (MISCELLANEOUS) ×2 IMPLANT
SAW OSC TIP CART 19.5X105X1.3 (SAW) ×2 IMPLANT
SEALER BIPOLAR AQUA 6.0 (INSTRUMENTS) ×2 IMPLANT
SET HNDPC FAN SPRY TIP SCT (DISPOSABLE) ×1 IMPLANT
SET PAD KNEE POSITIONER (MISCELLANEOUS) ×2 IMPLANT
SPONGE DRAIN TRACH 4X4 STRL 2S (GAUZE/BANDAGES/DRESSINGS) ×2 IMPLANT
SPONGE LAP 18X18 RF (DISPOSABLE) ×2 IMPLANT
STAPLER VISISTAT 35W (STAPLE) ×2 IMPLANT
STEM FEM VG 14X120 (Stem) ×2 IMPLANT
STEM FEM VG 16X120 (Stem) IMPLANT
STEM SPLINED KNEE 12X120MM (Joint) ×2 IMPLANT
SUT ETHILON 2 0 PSLX (SUTURE) ×12 IMPLANT
SUT MNCRL AB 3-0 PS2 18 (SUTURE) ×2 IMPLANT
SUT MON AB 2-0 CT1 36 (SUTURE) ×6 IMPLANT
SUT STRATAFIX PDO 1 14 VIOLET (SUTURE) ×1
SUT STRATFX PDO 1 14 VIOLET (SUTURE) ×1
SUT VIC AB 1 CTX 36 (SUTURE) ×3
SUT VIC AB 1 CTX36XBRD ANBCTR (SUTURE) ×3 IMPLANT
SUT VIC AB 2-0 CT1 27 (SUTURE) ×3
SUT VIC AB 2-0 CT1 TAPERPNT 27 (SUTURE) ×3 IMPLANT
SUTURE STRATFX PDO 1 14 VIOLET (SUTURE) ×1 IMPLANT
TIBIAL BEARING VG 71X75X14 (Joint) ×2 IMPLANT
TIBIAL TRAY W/TI LOCK BAR SCRE (Knees) ×2 IMPLANT
TM TIBIAL CONE MED 31 KNEE (Miscellaneous) ×2 IMPLANT
TOWER CARTRIDGE SMART MIX (DISPOSABLE) ×2 IMPLANT
TRAY FOLEY MTR SLVR 16FR STAT (SET/KITS/TRAYS/PACK) ×2 IMPLANT
TRAY TIBIAL W/TI LOCK BAR SCRE (Knees) ×1 IMPLANT
WATER STERILE IRR 1000ML POUR (IV SOLUTION) ×2 IMPLANT
WRAP KNEE MAXI GEL POST OP (GAUZE/BANDAGES/DRESSINGS) ×2 IMPLANT
YANKAUER SUCT BULB TIP 10FT TU (MISCELLANEOUS) ×2 IMPLANT

## 2020-02-27 NOTE — Addendum Note (Signed)
Addendum  created 02/27/20 1846 by Mellody Dance, MD   Attestation recorded in Intraprocedure, Intraprocedure Attestations filed

## 2020-02-27 NOTE — Anesthesia Procedure Notes (Signed)
Anesthesia Regional Block: Adductor canal block   Pre-Anesthetic Checklist: ,, timeout performed, Correct Patient, Correct Site, Correct Laterality, Correct Procedure, Correct Position, site marked, Risks and benefits discussed,  Surgical consent,  Pre-op evaluation,  At surgeon's request and post-op pain management  Laterality: Left  Prep: chloraprep       Needles:   Needle Type: Stimiplex     Needle Length: 9cm  Needle Gauge: 21     Additional Needles:   Procedures:,,,, ultrasound used (permanent image in chart),,,,  Narrative:  Start time: 02/27/2020 6:50 AM End time: 02/27/2020 6:55 AM Injection made incrementally with aspirations every 5 mL.  Performed by: Personally  Anesthesiologist: Mellody Dance, MD

## 2020-02-27 NOTE — Anesthesia Postprocedure Evaluation (Signed)
Anesthesia Post Note  Patient: Alexandria Gibbs  Procedure(s) Performed: TOTAL KNEE REVISION (Left Knee)     Patient location during evaluation: PACU Anesthesia Type: Regional Level of consciousness: awake and alert Pain management: pain level controlled Vital Signs Assessment: post-procedure vital signs reviewed and stable Respiratory status: spontaneous breathing and respiratory function stable Cardiovascular status: blood pressure returned to baseline and stable Postop Assessment: spinal receding Anesthetic complications: no   No complications documented.  Last Vitals:  Vitals:   02/27/20 1400 02/27/20 1415  BP: 129/75 (!) 143/82  Pulse: 76 80  Resp: 10 11  Temp: 36.5 C   SpO2: 100% 100%    Last Pain:  Vitals:   02/27/20 1400  TempSrc:   PainSc: Asleep                 Shayn Madole DANIEL

## 2020-02-27 NOTE — Anesthesia Procedure Notes (Signed)
Spinal  Patient location during procedure: OR Start time: 02/27/2020 7:40 AM Staffing Performed: resident/CRNA  Anesthesiologist: Merlinda Frederick, MD Resident/CRNA: Eben Burow, CRNA Preanesthetic Checklist Completed: patient identified, IV checked, site marked, risks and benefits discussed, surgical consent, monitors and equipment checked, pre-op evaluation and timeout performed Spinal Block Patient position: sitting Prep: DuraPrep and site prepped and draped Patient monitoring: continuous pulse ox, blood pressure, heart rate and cardiac monitor Approach: midline Location: L3-4 Injection technique: single-shot Needle Needle type: Pencan  Needle gauge: 24 G Needle length: 9 cm Assessment Sensory level: T4 Additional Notes Pt placed in sitting position, spinal kit expiration date checked and verified, + CSF, - heme, pt tolerated well. Dr Elgie Congo present and supervising throughout.

## 2020-02-27 NOTE — Anesthesia Procedure Notes (Signed)
Procedure Name: MAC Date/Time: 02/27/2020 7:37 AM Performed by: Eben Burow, CRNA Pre-anesthesia Checklist: Patient identified, Emergency Drugs available, Suction available, Patient being monitored and Timeout performed Oxygen Delivery Method: Simple face mask Placement Confirmation: positive ETCO2

## 2020-02-27 NOTE — Transfer of Care (Signed)
Immediate Anesthesia Transfer of Care Note  Patient: Carolynne Edouard  Procedure(s) Performed: TOTAL KNEE REVISION (Left Knee)  Patient Location: PACU  Anesthesia Type:Spinal  Level of Consciousness: drowsy and responds to stimulation  Airway & Oxygen Therapy: Patient Spontanous Breathing and Patient connected to face mask oxygen  Post-op Assessment: Report given to RN and Post -op Vital signs reviewed and stable  Post vital signs: Reviewed and stable  Last Vitals:  Vitals Value Taken Time  BP 151/87 02/27/20 1303  Temp    Pulse 99 02/27/20 1305  Resp 13 02/27/20 1305  SpO2 100 % 02/27/20 1305  Vitals shown include unvalidated device data.  Last Pain:  Vitals:   02/27/20 0626  TempSrc:   PainSc: 0-No pain      Patients Stated Pain Goal: 4 (02/27/20 2536)  Complications: No complications documented.

## 2020-02-27 NOTE — Interval H&P Note (Signed)
History and Physical Interval Note:  02/27/2020 7:15 AM  Carolynne Edouard  has presented today for surgery, with the diagnosis of Failed left total knee arthroplasty.  The various methods of treatment have been discussed with the patient and family. After consideration of risks, benefits and other options for treatment, the patient has consented to  Procedure(s): TOTAL KNEE REVISION (Left) as a surgical intervention.  The patient's history has been reviewed, patient examined, no change in status, stable for surgery.  I have reviewed the patient's chart and labs.  Questions were answered to the patient's satisfaction.    The risks, benefits, and alternatives were discussed with the patient. There are risks associated with the surgery including, but not limited to, problems with anesthesia (death), infection, instability (giving out of the joint), dislocation, differences in leg length/angulation/rotation, fracture of bones, loosening or failure of implants, hematoma (blood accumulation) which may require surgical drainage, blood clots, pulmonary embolism, nerve injury (foot drop and lateral thigh numbness), and blood vessel injury. The patient understands these risks and elects to proceed.    Iline Oven Mardel Grudzien

## 2020-02-27 NOTE — Op Note (Signed)
OPERATIVE REPORT   02/27/2020  1:11 PM  PATIENT:  Alexandria Gibbs   SURGEON:  Jonette Pesa, MD  ASSISTANT:  Barrie Dunker, PA-C.   PREOPERATIVE DIAGNOSIS:  Failed left total knee arthroplasty.  POSTOPERATIVE DIAGNOSIS:  Same.  PROCEDURE: Revision left total knee arthroplasty, femoral and tibial components  ANESTHESIA:   Spinal plus regional.  ANTIBIOTICS: 2 g Ancef.  IMPLANTS: Biomet Vanguard 360 femoral component size 65 mm with 12 x 120 mm splined stem, 15 mm distal lateral augment and 10 mm posterior lateral augment. Trabecular metal femoral diaphyseal cone size small left. 360 system tibial tray size 71 mm with 14 x 120 mm splined stem. Trabecular metal tibial cone size medium. Constrained PS tibial bearing size 14 mm.  EXPLANTS: Zimmer total knee system with PS femoral component, monoblock tibial component.  SPECIMENS: Left knee synovial fluid for cell count with differential and culture. Tibial interface culture.  TUBES AND DRAINS: Medium Hemovac x1. Prevena negative pressure incisional wound dressing.  COMPLICATIONS: None.  DISPOSITION: Stable to PACU.  SURGICAL INDICATIONS:  Alexandria Gibbs is a 74 y.o. female with a diagnosis of failed left total knee arthroplasty with subsided tibial component on x-ray.  Preoperative work-up revealed abnormality of inflammatory markers.  Aspiration was performed which was negative for infection.  She was indicated for revision total knee arthroplasty.  The risks, benefits, and alternatives were discussed with the patient. There are risks associated with the surgery including, but not limited to, problems with anesthesia (death), infection, instability (giving out of the joint), dislocation, differences in leg length/angulation/rotation, fracture of bones, loosening or failure of implants, hematoma (blood accumulation) which may require surgical drainage, blood clots, pulmonary embolism, nerve injury (foot drop and lateral  thigh numbness), and blood vessel injury. The patient understands these risks and elects to proceed.  PROCEDURE IN DETAIL: Patient was identified in the holding area using 2 identifiers.  The surgical site was marked by myself.  Adductor canal block was placed by anesthesia team.  She was taken the operating room, placed supine on the operating room table.  Spinal anesthesia was obtained.  She was placed supine on the operating room table.  All bony prominences were well-padded.  Foley catheter was placed.  Nonsterile tourniquet was applied to the left upper thigh.  The left lower extremity was prepped and draped in the normal sterile surgical fashion.  Timeout was called, verifying site and site of surgery.  Patient did receive preop antibiotics within 60 minutes of beginning the procedure.  Previous anterior wound incision was excised with a #10 blade.  Please note that the exposure was done without the use of the tourniquet.  Full-thickness skin flaps were created.  18-gauge needle was used to aspirate the knee, revealing clear synovial fluid.  The synovial fluid was sent to the lab for cell count with differential and culture.  I then created a standard medial parapatellar arthrotomy.  Again, joint fluid was clear.  There is a lot of scar tissue within the knee.  Medial release was performed.  The knee was brought into extension.  Radical synovectomy was performed of the lateral gutter, medial gutter, and suprapatellar pouch.  Infrapatellar scar was sharply excised using Bovie electrocautery.  The knee was then brought in the flexion.  The tibial component was grossly loose and subsided into varus.  Scar tissue was cleaned from the femoral component.  Using an osteotome, I dislodged the polyliner.  The polyliner was inspected.  There was very significant wear, oxidation,  and pitting.  The poly was passed to the back table.  Using an ACL saw blade and flexible osteotomes, I loosened the bone cement  interface of the femoral component.  The femoral component was removed without any significant bone loss.  Examination of the distal femur revealed that the medial condyle was reasonably intact.  The lateral condyle had significant fibrous scarring and there was minimal structural bone.  The tibial component was easily removed by hand.  Culture swab of the tibial interface was obtained and sent as a specimen.  Entry drill was used to gain access to the medullary canal of the tibia.  Cement was removed with osteotomes and back scratchers.  I reamed sequentially up to a size 14 mm reamer with excellent cortical chatter.  Cleanup proximal tibia cut was performed.  There is an uncontained defect of the tibia medially.  The tibia was sized to a 71 tray.  I reamed and broached for a medium augment.  Trial tray was pinned in place.  I returned my attention to the femur.  Sequential reaming was performed up to a size 16 mm reamer.  Cleanup distal cut was made.  I cut for a 15 mm distal femoral augment.  The femur was sized to a 65 mm component, which was pinned in the place.  Box cut was created.  I then broached for a size small diaphyseal cone.  The trial cone and femoral component were then inserted into place.  Poly liner insert was placed and the knee was brought through a range of motion.  There was excellent balance of the flexion and extension gaps.  There was good varus/valgus stability throughout a range of motion.  The knee was brought into extension and I examined the patellar component.  Patellar component had some bony overgrowth which I removed with a rongeur.  There was no significant wear of the polycomponent.  There was no loosening.  Therefore, I elected to retain the patellar component.  The tourniquet was inflated to 300 mmHg.  The bony surfaces were irrigated and dried.  On the back table, the real implants were opened and assembled.  The proximal tibia and distal femoral cones were then  impacted.  2 packs of refobacin cement with gentamicin were mixed and the tibial component was cemented using hybrid fixation technique.  2 packs of refobacin cement with gentamicin were mixed and the femoral component was then cemented using hybrid fixation technique.  Trial liner was placed and the knee was brought into extension.  Excess cement was cleared.  Once the cement was fully polymerized, the trial liner was exchanged for the real liner.  The locking bar was placed.  The tourniquet was let down, and meticulous hemostasis was achieved with the aqua mantis and Bovie electrocautery.  Final stability testing was performed.  There was excellent balance of the flexion and extension gaps.  There was no varus/valgus instability.  Patella tracked centrally throughout the range of motion.  The knee was irrigated with irrisept solution followed by normal saline with pulse lavage.  The arthrotomy was closed over a medium Hemovac drain with #1 Vicryl and #1 strata fix suture.  Deep fatty layer was reapproximated with 2-0 Vicryl.  Deep dermal layer was reapproximated with 2-0 Monocryl.  Skin was closed with 2-0 nylon vertical mattress suture and staples. Prevena negative pressure dressing was placed according to manufacturer's instructions.  Suction was applied to 125 mmHg without any leak.  Bulky dressing was placed.  Patient  was then awakened from anesthesia and taken to the PACU in stable condition.  Sponge, needle, and instrument counts were correct at the end of the case x2.  There were no known complications.  POSTOPERATIVE PLAN: Postoperatively, the patient be admitted to the orthopedic floor.  Touchdown weightbearing left lower extremity with a walker.  Unlimited range of motion left knee.  Begin Eliquis for DVT prophylaxis starting tomorrow morning.  Routine antibiotic prophylaxis.  Pain control.  Mobilize out of bed with PT.  Patient will undergo disposition planning.  Upon discharge, the house VAC  suction unit will be switched to the portable Prevena unit.  She will need to follow-up in the office within 7 days for removal of the incisional wound VAC dressing.

## 2020-02-28 DIAGNOSIS — T84093A Other mechanical complication of internal left knee prosthesis, initial encounter: Secondary | ICD-10-CM | POA: Diagnosis not present

## 2020-02-28 LAB — CBC
HCT: 25.9 % — ABNORMAL LOW (ref 36.0–46.0)
Hemoglobin: 8.6 g/dL — ABNORMAL LOW (ref 12.0–15.0)
MCH: 29.5 pg (ref 26.0–34.0)
MCHC: 33.2 g/dL (ref 30.0–36.0)
MCV: 88.7 fL (ref 80.0–100.0)
Platelets: 217 10*3/uL (ref 150–400)
RBC: 2.92 MIL/uL — ABNORMAL LOW (ref 3.87–5.11)
RDW: 12.1 % (ref 11.5–15.5)
WBC: 11 10*3/uL — ABNORMAL HIGH (ref 4.0–10.5)
nRBC: 0 % (ref 0.0–0.2)

## 2020-02-28 LAB — BASIC METABOLIC PANEL
Anion gap: 9 (ref 5–15)
BUN: 23 mg/dL (ref 8–23)
CO2: 24 mmol/L (ref 22–32)
Calcium: 8.1 mg/dL — ABNORMAL LOW (ref 8.9–10.3)
Chloride: 102 mmol/L (ref 98–111)
Creatinine, Ser: 1.03 mg/dL — ABNORMAL HIGH (ref 0.44–1.00)
GFR calc Af Amer: >60 mL/min (ref >60)
GFR calc non Af Amer: 55 mL/min — ABNORMAL LOW (ref >60)
Glucose, Bld: 121 mg/dL — ABNORMAL HIGH (ref 70–99)
Potassium: 4.3 mmol/L (ref 3.5–5.1)
Sodium: 135 mmol/L (ref 135–145)

## 2020-02-28 LAB — CYTOLOGY - NON PAP

## 2020-02-28 MED ORDER — PRAVASTATIN SODIUM 20 MG PO TABS
40.0000 mg | ORAL_TABLET | Freq: Every day | ORAL | Status: DC
  Administered 2020-02-28 – 2020-02-29 (×2): 40 mg via ORAL
  Filled 2020-02-28 (×2): qty 2

## 2020-02-28 MED ORDER — HYDROCODONE-ACETAMINOPHEN 5-325 MG PO TABS: 1.0000 | ORAL_TABLET | ORAL | 0 refills | Status: DC | PRN

## 2020-02-28 MED ORDER — DOCUSATE SODIUM 100 MG PO CAPS: 100.0000 mg | ORAL_CAPSULE | Freq: Two times a day (BID) | ORAL | 0 refills | Status: AC

## 2020-02-28 MED ORDER — LORATADINE 10 MG PO TABS
10.0000 mg | ORAL_TABLET | Freq: Every day | ORAL | Status: DC
  Administered 2020-02-28 – 2020-03-01 (×3): 10 mg via ORAL
  Filled 2020-02-28 (×3): qty 1

## 2020-02-28 MED ORDER — ONDANSETRON HCL 4 MG PO TABS: 4.0000 mg | ORAL_TABLET | Freq: Four times a day (QID) | ORAL | 0 refills | Status: AC | PRN

## 2020-02-28 MED ORDER — SENNA 8.6 MG PO TABS: 2.0000 | ORAL_TABLET | Freq: Every day | ORAL | 1 refills | Status: AC

## 2020-02-28 MED ORDER — GABAPENTIN 100 MG PO CAPS
100.0000 mg | ORAL_CAPSULE | Freq: Every day | ORAL | Status: DC
  Administered 2020-02-28 – 2020-03-01 (×3): 100 mg via ORAL
  Filled 2020-02-28 (×3): qty 1

## 2020-02-28 MED ORDER — PANTOPRAZOLE SODIUM 40 MG PO TBEC
40.0000 mg | DELAYED_RELEASE_TABLET | Freq: Every day | ORAL | Status: DC
  Administered 2020-02-28 – 2020-03-01 (×3): 40 mg via ORAL
  Filled 2020-02-28 (×3): qty 1

## 2020-02-28 MED ORDER — APIXABAN 2.5 MG PO TABS: 2.5000 mg | ORAL_TABLET | Freq: Two times a day (BID) | ORAL | 0 refills | Status: AC

## 2020-02-28 MED ORDER — MONTELUKAST SODIUM 10 MG PO TABS
10.0000 mg | ORAL_TABLET | Freq: Every day | ORAL | Status: DC
  Administered 2020-02-28 – 2020-02-29 (×2): 10 mg via ORAL
  Filled 2020-02-28 (×2): qty 1

## 2020-02-28 NOTE — Progress Notes (Signed)
Subjective:  Patient reports pain as mild to moderate.  Denies N/V/CP/SOB. No c/o.  Objective:   VITALS:   Vitals:   02/27/20 1804 02/27/20 2151 02/28/20 0107 02/28/20 0506  BP: (!) 142/74 (!) 112/57 (!) 143/65 (!) 121/55  Pulse: 66 66 64 64  Resp: 16 17 15 17   Temp: 97.8 F (36.6 C) 97.7 F (36.5 C) 97.8 F (36.6 C) 98.2 F (36.8 C)  TempSrc:      SpO2: 100% 100% 100% 98%  Weight:      Height:       HV 500 cc since surgery  NAD ABD soft Sensation intact distally Intact pulses distally Dorsiflexion/Plantar flexion intact Incision: dressing C/D/I Compartment soft HV ss iVAC intact   Lab Results  Component Value Date   WBC 11.0 (H) 02/28/2020   HGB 8.6 (L) 02/28/2020   HCT 25.9 (L) 02/28/2020   MCV 88.7 02/28/2020   PLT 217 02/28/2020   BMET    Component Value Date/Time   NA 135 02/28/2020 0239   K 4.3 02/28/2020 0239   CL 102 02/28/2020 0239   CO2 24 02/28/2020 0239   GLUCOSE 121 (H) 02/28/2020 0239   BUN 23 02/28/2020 0239   CREATININE 1.03 (H) 02/28/2020 0239   CALCIUM 8.1 (L) 02/28/2020 0239   GFRNONAA 55 (L) 02/28/2020 0239   GFRAA >60 02/28/2020 0239   Recent Results (from the past 240 hour(s))  Surgical pcr screen     Status: None   Collection Time: 02/24/20  1:45 PM   Specimen: Nasal Mucosa; Nasal Swab  Result Value Ref Range Status   MRSA, PCR NEGATIVE NEGATIVE Final   Staphylococcus aureus NEGATIVE NEGATIVE Final    Comment: (NOTE) The Xpert SA Assay (FDA approved for NASAL specimens in patients 24 years of age and older), is one component of a comprehensive surveillance program. It is not intended to diagnose infection nor to guide or monitor treatment. Performed at [facility], [address], [place], [place]   SARS CORONAVIRUS 2 (TAT 6-24 HRS) Nasopharyngeal Nasopharyngeal Swab     Status: None   Collection Time: 02/24/20  2:29 PM   Specimen: Nasopharyngeal Swab  Result Value Ref Range Status    SARS Coronavirus 2 NEGATIVE NEGATIVE Final    Comment: (NOTE) SARS-CoV-2 target nucleic acids are NOT DETECTED.  The SARS-CoV-2 RNA is generally detectable in upper and lower respiratory specimens during the acute phase of infection. Negative results do not preclude SARS-CoV-2 infection, do not rule out co-infections with other pathogens, and should not be used as the sole basis for treatment or other patient management decisions. Negative results must be combined with clinical observations, patient history, and epidemiological information. The expected result is Negative.  Fact Sheet for Patients: 02/26/20  Fact Sheet for Healthcare Providers: HairSlick.no  This test is not yet approved or cleared by the quierodirigir.com FDA and  has been authorized for detection and/or diagnosis of SARS-CoV-2 by FDA under an Emergency Use Authorization (EUA). This EUA will remain  in effect (meaning this test can be used) for the duration of the COVID-19 declaration under Se ction 564(b)(1) of the Act, 21 U.S.C. section 360bbb-3(b)(1), unless the authorization is terminated or revoked sooner.  Performed at [facility], [address], [place], [place]   Body fluid culture     Status: None (Preliminary result)   Collection Time: 02/27/20  8:23 AM   Specimen: Synovial, Left Knee; Body Fluid  Result Value Ref Range Status   Specimen Description   Final    Synov, Knee Performed at [facility], [address], [place], [place] [address]    Special Requests   Final    NONE Performed at [facility], [address], [place], [place] [address]    Gram Stain   Final    RARE WBC PRESENT,BOTH PMN AND MONONUCLEAR NO ORGANISMS SEEN Gram Stain Report Called to,Read Back By and Verified With: Lamount Cranker RN @0954  ON 8.19.2021 MECIAL,J.  Performed at [facility], [address], [place], [place]    Culture   Final    NO GROWTH < 24 HOURS Performed at [facility], [address] [place], [place]    Report Status PENDING  Incomplete  Aerobic/Anaerobic Culture (surgical/deep wound)     Status: None (Preliminary result)   Collection Time: 02/27/20  9:01 AM   Specimen: Joint, Other; Body Fluid  Result Value Ref Range Status   Specimen Description   Final    KNEE LEFT Performed at [facility], [address], [place], [place]    Special Requests   Final    NONE Performed at [facility], [address] [place], [place]    Gram Stain   Final    RARE WBC PRESENT,BOTH PMN AND MONONUCLEAR NO ORGANISMS SEEN Gram Stain Report Called to,Read Back By and Verified With: YOUNG,F. RN @1024  ON 8.19.2021 BY [facility] Performed at [facility], [address] [place], [place]    Culture   Final    NO GROWTH < 24 HOURS Performed at [facility], [address], [place], [address] [place]    Report Status PENDING  Incomplete      Assessment/Plan: 1 Day Post-Op   Principal Problem:   Failed total knee, left (HCC) Active Problems:   Status post revision of total knee replacement, left   TDWB LLE with walker OK for ROM L knee DVT ppx: apixaban, SCDs, TEDS PO pain control PT/OT Dispo: d/c HV drain when output < 30 cc/shift, D/C planning, upon d/c home convert house [facility] unit to portable Prevena unit (@ bedside)    [id number] Alexandria Gibbs 02/28/2020, 12:08 PM   Iline Oven, MD [phone] [facility] is now [facility]  [place] [address], [address], [place], [address] [place] Phone: [phone] [email or web address] Facebook  [phone]

## 2020-02-28 NOTE — Discharge Summary (Signed)
Physician Discharge Summary  Patient ID: Alexandria Gibbs MRN: [id number] DOB/AGE: 72-Aug-1947 72 y.o.  Admit date: 02/27/2020 Discharge date: 03/01/2020  Admission Diagnoses:  Failed total knee, left (HCC)  Discharge Diagnoses:  Principal Problem:   Failed total knee, left (HCC) Active Problems:   Status post revision of total knee replacement, left   Past Medical History:  Diagnosis Date   Anemia    Arthritis    Diabetes mellitus without complication (HCC)    GERD (gastroesophageal reflux disease)    Headache    History of kidney stones    Hypertension    Sleep apnea     Surgeries: Procedure(s): TOTAL KNEE REVISION on 02/27/2020   Consultants (if any):    Discharged Condition: Improved  Hospital Course: Alexandria Gibbs is an 74 y.o. female who was admitted 02/27/2020 with a diagnosis of Failed total knee, left (HCC) and went to the operating room on 02/27/2020 and underwent the above named procedures.    She was given perioperative antibiotics:  Anti-infectives (From admission, onward)    Start     Dose/Rate Route Frequency Ordered Stop   02/27/20 1530  ceFAZolin (ANCEF) IVPB 2g/100 mL premix        2 g 200 mL/hr over 30 Minutes Intravenous Every 6 hours 02/27/20 1440 02/27/20 2054   02/27/20 0600  ceFAZolin (ANCEF) IVPB 2g/100 mL premix        2 g 200 mL/hr over 30 Minutes Intravenous On call to O.R. 02/27/20 0086 02/27/20 0745     .  Touch down weight bearing left lower extremity with walker. Prevena iVAC monitored and had no output.  She was given sequential compression devices, early ambulation, and apixaban for DVT prophylaxis.  She benefited maximally from the hospital stay and there were no complications.    Recent vital signs:  Vitals:   02/29/20 2042 03/01/20 0534  BP: (!) 114/59 (!) 128/51  Pulse: 74 71  Resp: 18 18  Temp: 99.1 F (37.3 C) 97.8 F (36.6 C)  SpO2: 99% 100%    Recent laboratory studies:  Lab Results  Component Value Date   HGB  7.7 (L) 03/01/2020   HGB 7.7 (L) 02/29/2020   HGB 8.6 (L) 02/28/2020   Lab Results  Component Value Date   WBC 9.0 03/01/2020   PLT 218 03/01/2020   Lab Results  Component Value Date   INR 1.1 02/24/2020   Lab Results  Component Value Date   NA 135 02/28/2020   K 4.3 02/28/2020   CL 102 02/28/2020   CO2 24 02/28/2020   BUN 23 02/28/2020   CREATININE 1.03 (H) 02/28/2020   GLUCOSE 121 (H) 02/28/2020   Recent Results (from the past 240 hour(s))  Body fluid culture     Status: None   Collection Time: 02/27/20  8:23 AM   Specimen: Synovial, Left Knee; Body Fluid  Result Value Ref Range Status   Specimen Description   Final    Synov, Knee Performed at [facility], [address] [place], [place] [address]    Special Requests   Final    NONE Performed at [facility], [address] [place], [place] [address]    Gram Stain   Final    RARE WBC PRESENT,BOTH PMN AND MONONUCLEAR NO ORGANISMS SEEN Gram Stain Report Called to,Read Back By and Verified With: Lamount Cranker RN @0954  ON 8.19.2021 MECIAL,J.  Performed at [facility], [address], [place], [place]  Culture   Final    NO GROWTH Performed at [facility], [address] [place], [place] [address]    Report Status 03/01/2020 FINAL  Final  Aerobic/Anaerobic Culture (surgical/deep wound)     Status: None   Collection Time: 02/27/20  9:01 AM   Specimen: Joint, Other; Body Fluid  Result Value Ref Range Status   Specimen Description   Final    KNEE LEFT Performed at [facility], [address], [place], [place] [address]    Special Requests   Final    NONE Performed at [facility], [address] [place], [place] [address]    Gram Stain   Final    RARE WBC PRESENT,BOTH PMN AND MONONUCLEAR NO ORGANISMS SEEN Gram Stain Report Called to,Read Back By and Verified With: YOUNG,F. RN @1024  ON 8.19.2021 BY  [facility] Performed at [facility], [address], [place], [place]    Culture   Final    No growth aerobically or anaerobically. Performed at [facility], [address] [place] [address], [place], [place]    Report Status 03/03/2020 FINAL  Final      Discharge Medications:   Allergies as of 03/01/2020       Reactions   Peach [prunus Persica] Swelling   Sulfa Antibiotics Itching, Swelling        Medication List     STOP taking these medications    acetaminophen 500 MG tablet Commonly known as: TYLENOL   Bydureon 2 MG Pen Generic drug: Exenatide ER   ibuprofen 400 MG tablet Commonly known as: ADVIL   loratadine 10 MG tablet Commonly known as: CLARITIN   metFORMIN 500 MG tablet Commonly known as: GLUCOPHAGE   methocarbamol 500 MG tablet Commonly known as: ROBAXIN   Prenatal 27-1 MG Tabs   traMADol 50 MG tablet Commonly known as: ULTRAM       TAKE these medications    albuterol 108 (90 Base) MCG/ACT inhaler Commonly known as: VENTOLIN HFA Inhale 2 puffs into the lungs every 6 (six) hours as needed for wheezing or shortness of breath.   apixaban 2.5 MG Tabs tablet Commonly known as: ELIQUIS Take 1 tablet (2.5 mg total) by mouth every 12 (twelve) hours.   benazepril-hydrochlorthiazide 20-12.5 MG tablet Commonly known as: LOTENSIN HCT Take 1 tablet by mouth daily.   cetirizine 10 MG tablet Commonly known as: ZYRTEC Take 10 mg by mouth daily.   docusate sodium 100 MG capsule Commonly known as: COLACE Take 1 capsule (100 mg total) by mouth 2 (two) times daily.   gabapentin 100 MG capsule Commonly known as: NEURONTIN Take 100 mg by mouth daily.   HYDROcodone-acetaminophen 5-325 MG tablet Commonly known as: NORCO/VICODIN Take 1 tablet by mouth every 4 (four) hours as needed for moderate pain (pain score 4-6).   Magnesium 250 MG Tabs Take 250 mg by mouth daily.   montelukast 10 MG tablet Commonly known as:  SINGULAIR Take 10 mg by mouth daily as needed (allergies).   ondansetron 4 MG tablet Commonly known as: ZOFRAN Take 1 tablet (4 mg total) by mouth every 6 (six) hours as needed for nausea.   pantoprazole 40 MG tablet Commonly known as: PROTONIX Take 40 mg by mouth 2 (two) times daily.   pravastatin 40 MG tablet Commonly known as: PRAVACHOL Take 40 mg by mouth every evening.   psyllium 58.6 % powder Commonly known as: METAMUCIL Take 1 packet by mouth daily.  pyridOXINE 100 MG tablet Commonly known as: VITAMIN B-6 Take 100 mg by mouth daily.   senna 8.6 MG Tabs tablet Commonly known as: SENOKOT Take 2 tablets (17.2 mg total) by mouth at bedtime.   tiZANidine 4 MG tablet Commonly known as: ZANAFLEX Take 4 mg by mouth 3 (three) times daily as needed for muscle spasms.   Trulicity 0.75 MG/0.5ML Sopn Generic drug: Dulaglutide Inject 75 mg into the skin every 7 (seven) days.   vitamin B-12 1000 MCG tablet Commonly known as: CYANOCOBALAMIN Take 1,000 mcg by mouth daily.   Vitamin D (Ergocalciferol) 1.25 MG (50000 UNIT) Caps capsule Commonly known as: DRISDOL Take 50,000 Units by mouth every 7 (seven) days.   Xigduo XR 11-998 MG Tb24 Generic drug: Dapagliflozin-metFORMIN HCl ER Take 1 tablet by mouth daily.        Diagnostic Studies: DG Knee Left Port  Result Date: 02/27/2020 CLINICAL DATA:  74 year old female status post revision of left total knee arthroplasty. EXAM: PORTABLE LEFT KNEE - 1-2 VIEW COMPARISON:  None. FINDINGS: AP and portable cross-table lateral views. Left total knee arthroplasty hardware with anterior suprapatellar drain in place. Hardware appears intact and normally aligned. Superimposed chronic degenerative osseous changes with dystrophic calcifications about the knee. No unexpected osseous changes identified. Patella appears intact. Small volume postoperative gas and fluid in the joint. Overlying skin staples. IMPRESSION: Left total knee arthroplasty  with no adverse features. Electronically Signed   By: Odessa Fleming M.D.   On: 02/27/2020 14:19     Disposition: Discharge disposition: 01-Home or Self Care      Discharge Instructions     Call MD / Call 911   Complete by: As directed    If you experience chest pain or shortness of breath, CALL 911 and be transported to the hospital emergency room.  If you develope a fever above 101 F, pus (white drainage) or increased drainage or redness at the wound, or calf pain, call your surgeon's office.   Constipation Prevention   Complete by: As directed    Drink plenty of fluids.  Prune juice may be helpful.  You may use a stool softener, such as Colace (over the counter) 100 mg twice a day.  Use MiraLax (over the counter) for constipation as needed.   Diet - low sodium heart healthy   Complete by: As directed    Increase activity slowly as tolerated   Complete by: As directed         Follow-up Information     Nahsir Venezia, Arlys John, MD. Schedule an appointment as soon as possible for a visit in 1 week.   Specialty: Orthopedic Surgery Why: [facility] removal Contact information: [address] [place] [address] [phone]         [facility] Follow up.   Why: agency will provide home health physical therapy Contact information: [address] [place] [address] [phone]                  Signed: Iline Oven Elzy Tomasello 03/07/2020, 8:11 AM

## 2020-02-28 NOTE — Evaluation (Signed)
Physical Therapy Evaluation Patient Details Name: Alexandria Gibbs MRN: [id number] DOB: [birth year] Today's Date: 02/28/2020   History of Present Illness  s/p L TKA revision. PMH: HTN, DM, L TKA, arthritis  Clinical Impression  Pt is s/p TKA revision resulting in the deficits listed below (see PT Problem List).  Pt is independent at her baseline however will need supervision/assist for mobility at d/c. Pt lives alone essentially (has only a roommate that is a Metallurgist and is unable to assist her). Recommend  HHPT vs SNF at d/c, pt is considering d/c to her dtr's home but still will likely not have transportation to get to OPPT.  Continue to follow in acute setting  Pt will benefit from skilled PT to increase their independence and safety with mobility to allow discharge to the venue listed below.      Follow Up Recommendations Follow surgeon's recommendation for DC plan and follow-up therapies;Supervision for mobility/OOB (needs HHPT vs SNF)    Equipment Recommendations  None recommended by PT    Recommendations for Other Services       Precautions / Restrictions Precautions Precautions: Fall;Knee Restrictions Weight Bearing Restrictions: Yes LLE Weight Bearing: Touchdown weight bearing Other Position/Activity Restrictions: no ROM restrictions      Mobility  Bed Mobility               General bed mobility comments: NT--to EOB with OT  Transfers Overall transfer level: Needs assistance Equipment used: Rolling walker (2 wheeled) Transfers: Sit to/from [employer] Sit to Stand: Min assist;Min guard Stand pivot transfers: Min assist;Min guard       General transfer comment: cues for hand placement and LLE position/TDWB  Ambulation/Gait Ambulation/Gait assistance: Min assist Gait Distance (Feet): 5 Feet Assistive device: Rolling walker (2 wheeled) Gait Pattern/deviations: Step-to pattern     General Gait Details: cues for sequence, RW position and  overall safety  Stairs            Wheelchair Mobility    Modified Rankin (Stroke Patients Only)       Balance Overall balance assessment: Needs assistance         Standing balance support: Single extremity supported;Bilateral upper extremity supported;During functional activity   Standing balance comment: reliant on at least unilateral UE support. LOB x1 while donning 2nd gown in  standing                             Pertinent Vitals/Pain Pain Assessment: Faces Faces Pain Scale: Hurts little more Pain Location: L knee with activity Pain Descriptors / Indicators: Discomfort;Grimacing Pain Intervention(s): Repositioned;Monitored during session;Limited activity within patient's tolerance;Premedicated before session;Ice applied    Home Living Family/patient expects to be discharged to:: Private residence Living Arrangements: Non-relatives/Friends   Type of Home: Mobile home Home Access: Stairs to enter Entrance Stairs-Rails: Lawyer of Steps: 7-8 Home Layout: One level Home Equipment: Environmental consultant - 2 wheels;Bedside commode Additional Comments: pt reports she is considering d/c to dtr's home (one level withno stairs)    Prior Function Level of Independence: Independent with assistive device(s)               Hand Dominance   Dominant Hand: Right    Extremity/Trunk Assessment   Upper Extremity Assessment Upper Extremity Assessment: Defer to OT evaluation    Lower Extremity Assessment Lower Extremity Assessment: LLE deficits/detail LLE Deficits / Details: knee AAROM grossly 8 to 60 degrees flexion. strength 2+/5  Communication   Communication: No difficulties  Cognition Arousal/Alertness: Awake/alert Behavior During Therapy: WFL for tasks assessed/performed Overall Cognitive Status: Within Functional Limits for tasks assessed                                        General Comments       Exercises Total Joint Exercises Ankle Circles/Pumps: AROM;Both;5 reps Heel Slides: Other (comment);AAROM;Left (7 reps)   Assessment/Plan    PT Assessment Patient needs continued PT services  PT Problem List Decreased strength;Decreased mobility;Decreased range of motion;Decreased activity tolerance;Decreased balance;Decreased knowledge of use of DME       PT Treatment Interventions DME instruction;Therapeutic exercise;Gait training;Functional mobility training;Therapeutic activities;Patient/family education;Stair training    PT Goals (Current goals can be found in the Care Plan section)  Acute Rehab PT Goals Patient Stated Goal: get better PT Goal Formulation: With patient Time For Goal Achievement: 03/06/20 Potential to Achieve Goals: Good    Frequency 7X/week   Barriers to discharge        Co-evaluation               AM-PAC PT "6 Clicks" Mobility  Outcome Measure Help needed turning from your back to your side while in a flat bed without using bedrails?: A Little Help needed moving from lying on your back to sitting on the side of a flat bed without using bedrails?: A Little Help needed moving to and from a bed to a chair (including a wheelchair)?: A Little Help needed standing up from a chair using your arms (e.g., wheelchair or bedside chair)?: A Little Help needed to walk in hospital room?: A Little Help needed climbing 3-5 steps with a railing? : A Lot 6 Click Score: 17    End of Session Equipment Utilized During Treatment: Gait belt Activity Tolerance: Patient limited by fatigue Patient left: in chair;with call bell/phone within reach   PT Visit Diagnosis: Difficulty in walking, not elsewhere classified (R26.2)    Time: 0174-9449 PT Time Calculation (min) (ACUTE ONLY): 19 min   Charges:   PT Evaluation $PT Eval Low Complexity: 1 Low          Eoghan Belcher, PT  Acute Rehab Dept ([facility]) [phone] Pager  [phone]  02/28/2020   [facility] 02/28/2020, 11:50 AM

## 2020-02-28 NOTE — Progress Notes (Signed)
Physical Therapy Treatment Patient Details Name: Alexandria Gibbs MRN: [id number] DOB: 1945/07/25 Today's Date: 02/28/2020    History of Present Illness s/p L TKA revision. PMH: HTN, DM, L TKA, arthritis    PT Comments    Pt is progressing well. incr gait distance/tolerance this pm. Able to maintain TDWB. Will continue to follow    Follow Up Recommendations  Follow surgeon's recommendation for DC plan and follow-up therapies;Supervision for mobility/OOB (HHPT )     Equipment Recommendations  None recommended by PT    Recommendations for Other Services       Precautions / Restrictions Precautions Precautions: Fall;Knee Restrictions Weight Bearing Restrictions: No LLE Weight Bearing: Touchdown weight bearing Other Position/Activity Restrictions: no ROM restrictions    Mobility  Bed Mobility Overal bed mobility: Needs Assistance Bed Mobility: Supine to Sit;Sit to Supine     Supine to sit: Min assist Sit to supine: Min assist   General bed mobility comments: min assist for LLE to transfer to edge of bed   Transfers Overall transfer level: Needs assistance Equipment used: Rolling walker (2 wheeled) Transfers: Sit to/from [employer] Sit to Stand: Min assist;Min guard Stand pivot transfers: Min assist;Min guard       General transfer comment: cues for hand placement and LLE position/TDWB  Ambulation/Gait Ambulation/Gait assistance: Min Chemical engineer (Feet): 20 Feet Assistive device: Rolling walker (2 wheeled) Gait Pattern/deviations: Step-to pattern     General Gait Details: cues for sequence, TDWB, RW position and overall safety   Stairs             Wheelchair Mobility    Modified Rankin (Stroke Patients Only)       Balance                                            Cognition Arousal/Alertness: Awake/alert Behavior During Therapy: WFL for tasks assessed/performed Overall Cognitive Status: Within  Functional Limits for tasks assessed                                        Exercises Total Joint Exercises Ankle Circles/Pumps: AROM;Both;15 reps Quad Sets: AROM;Both;10 reps Heel Slides: AAROM;Left;5 reps    General Comments        Pertinent Vitals/Pain Pain Assessment: Faces Pain Score: 4  Faces Pain Scale: Hurts little more Pain Location: L knee with activity - after ambulation. Pain Descriptors / Indicators: Discomfort;Grimacing Pain Intervention(s): Limited activity within patient's tolerance;Monitored during session;Premedicated before session;Repositioned    Home Living                      Prior Function            PT Goals (current goals can now be found in the care plan section) Acute Rehab PT Goals Patient Stated Goal: get better PT Goal Formulation: With patient Time For Goal Achievement: 03/06/20 Potential to Achieve Goals: Good Progress towards PT goals: Progressing toward goals    Frequency    7X/week      PT Plan Current plan remains appropriate    Co-evaluation              AM-PAC PT "6 Clicks" Mobility   Outcome Measure  Help needed turning from your back to your side while in  a flat bed without using bedrails?: A Little Help needed moving from lying on your back to sitting on the side of a flat bed without using bedrails?: A Little Help needed moving to and from a bed to a chair (including a wheelchair)?: A Little Help needed standing up from a chair using your arms (e.g., wheelchair or bedside chair)?: A Little Help needed to walk in hospital room?: A Little Help needed climbing 3-5 steps with a railing? : A Little 6 Click Score: 18    End of Session Equipment Utilized During Treatment: Gait belt Activity Tolerance: Patient tolerated treatment well Patient left: with call bell/phone within reach;in bed;with chair alarm set;with family/visitor present Nurse Communication: Mobility status PT Visit  Diagnosis: Difficulty in walking, not elsewhere classified (R26.2)     Time: 8563-1497 PT Time Calculation (min) (ACUTE ONLY): 34 min  Charges:  $Gait Training: 8-22 mins $Therapeutic Exercise: 8-22 mins                     Delice Bison, PT  Acute Rehab Dept ([facility]) [phone] Pager [phone]  02/28/2020    [facility] 02/28/2020, 4:57 PM

## 2020-02-28 NOTE — TOC Transition Note (Signed)
Transition of Care [facility]) - CM/SW Discharge Note   Patient Details  Name: Alexandria Gibbs MRN: [id number] Date of Birth: [birth year]  [facility] CM/SW Contact:  Lennart Pall, LCSW Phone Number: 02/28/2020, 4:38 PM   Clinical Narrative:    Met with pt and daughter today to discuss d/c follow up plans.  Pt and daughter confirm that pt to d/c to daughter's home locally where daughter and son-in-law can provide 24/7 support (address:  [address] [place]).  Daughter is also able to provide transportation for pt to go to OPPT at [facility].  DME delivered (see below).  No further TOC needs.   Final next level of care: OP Rehab Barriers to Discharge: No Barriers Identified   Patient Goals and CMS Choice Patient states their goals for this hospitalization and ongoing recovery are:: to d/c to daughter's home      Discharge Placement                       Discharge Plan and Services                DME Arranged: 3-N-1, Walker rolling, Shower stool DME Agency: [employer], [employer]     Representative spoke with at DME Agency: spoke with Ovid Curd with [employer] who supplied rw and 3n1;  spoke with Bethanne Ginger with [employer] who supplied tub seat HH Arranged: NA HH Agency: NA        Social Determinants of Health (SDOH) Interventions     Readmission Risk Interventions No flowsheet data found.

## 2020-02-28 NOTE — Discharge Instructions (Signed)
Dr. Samson Frederic Total Joint Specialist [facility] [address] [place], [place] [address] [phone]  TOTAL KNEE REPLACEMENT POSTOPERATIVE DIRECTIONS    Knee Rehabilitation, Guidelines Following Surgery  Results after knee surgery are often greatly improved when you follow the exercise, range of motion and muscle strengthening exercises prescribed by your doctor. Safety measures are also important to protect the knee from further injury. Any time any of these exercises cause you to have increased pain or swelling in your knee joint, decrease the amount until you are comfortable again and slowly increase them. If you have problems or questions, call your caregiver or physical therapist for advice.   WEIGHT BEARING Other:  touch down weight bearing left leg.  HOME CARE INSTRUCTIONS  Remove items at home which could result in a fall. This includes throw rugs or furniture in walking pathways.  Continue medications as instructed at time of discharge. You may have some home medications which will be placed on hold until you complete the course of blood thinner medication.  You may start showering once you are discharged home but do not submerge the incision under water. Just pat the incision dry and apply a dry gauze dressing on daily. Walk with walker as instructed.  You may resume a sexual relationship in one month or when given the OK by your doctor.   Use walker as long as suggested by your caregivers.  Avoid periods of inactivity such as sitting longer than an hour when not asleep. This helps prevent blood clots.  You may put full weight on your legs and walk as much as is comfortable.  You may return to work once you are cleared by your doctor.  Do not drive a car for 6 weeks or until released by you surgeon.   Do not drive while taking narcotics.  Wear the elastic stockings for three weeks following surgery during the day but you may remove then at  night. Make sure you keep all of your appointments after your operation with all of your doctors and caregivers. You should call the office at the above phone number and make an appointment for approximately two weeks after the date of your surgery. Do not remove your surgical dressing. The dressing is waterproof; you may take showers in 3 days, but do not take tub baths or submerge the dressing. Please pick up a stool softener and laxative for home use as long as you are requiring pain medications.  ICE to the affected knee every three hours for 30 minutes at a time and then as needed for pain and swelling.  Continue to use ice on the knee for pain and swelling from surgery. You may notice swelling that will progress down to the foot and ankle.  This is normal after surgery.  Elevate the leg when you are not up walking on it.   It is important for you to complete the blood thinner medication as prescribed by your doctor.  Continue to use the breathing machine which will help keep your temperature down.  It is common for your temperature to cycle up and down following surgery, especially at night when you are not up moving around and exerting yourself.  The breathing machine keeps your lungs expanded and your temperature down.  RANGE OF MOTION AND STRENGTHENING EXERCISES  Rehabilitation of the knee is important following a knee injury or an operation. After just a few days of immobilization, the muscles of the thigh which control  the knee become weakened and shrink (atrophy). Knee exercises are designed to build up the tone and strength of the thigh muscles and to improve knee motion. Often times heat used for twenty to thirty minutes before working out will loosen up your tissues and help with improving the range of motion but do not use heat for the first two weeks following surgery. These exercises can be done on a training (exercise) mat, on the floor, on a table or on a bed. Use what ever works the  best and is most comfortable for you Knee exercises include:  Leg Lifts - While your knee is still immobilized in a splint or cast, you can do straight leg raises. Lift the leg to 60 degrees, hold for 3 sec, and slowly lower the leg. Repeat 10-20 times 2-3 times daily. Perform this exercise against resistance later as your knee gets better.  Quad and Hamstring Sets - Tighten up the muscle on the front of the thigh (Quad) and hold for 5-10 sec. Repeat this 10-20 times hourly. Hamstring sets are done by pushing the foot backward against an object and holding for 5-10 sec. Repeat as with quad sets.  A rehabilitation program following serious knee injuries can speed recovery and prevent re-injury in the future due to weakened muscles. Contact your doctor or a physical therapist for more information on knee rehabilitation.   SKILLED REHAB INSTRUCTIONS: If the patient is transferred to a skilled rehab facility following release from the hospital, a list of the current medications will be sent to the facility for the patient to continue.  When discharged from the skilled rehab facility, please have the facility set up the patient's Home Health Physical Therapy prior to being released. Also, the skilled facility will be responsible for providing the patient with their medications at time of release from the facility to include their pain medication, the muscle relaxants, and their blood thinner medication. If the patient is still at the rehab facility at time of the two week follow up appointment, the skilled rehab facility will also need to assist the patient in arranging follow up appointment in our office and any transportation needs.  MAKE SURE YOU:  Understand these instructions.  Will watch your condition.  Will get help right away if you are not doing well or get worse.    Pick up stool softner and laxative for home use following surgery while on pain medications. Do NOT remove your dressing. You may  shower.  Do not take tub baths or submerge incision under water. May shower starting three days after surgery. Please use a clean towel to pat the incision dry following showers. Continue to use ice for pain and swelling after surgery. Do not use any lotions or creams on the incision until instructed by your surgeon.  Information on my medicine - ELIQUIS (apixaban)  This medication education was reviewed with me or my healthcare representative as part of my discharge preparation.   Why was Eliquis prescribed for you? Eliquis was prescribed for you to reduce the risk of blood clots forming after orthopedic surgery.    What do You need to know about Eliquis? Take your Eliquis TWICE DAILY - one tablet in the morning and one tablet in the evening with or without food.  It would be best to take the dose about the same time each day.  If you have difficulty swallowing the tablet whole please discuss with your pharmacist how to take the medication safely.  Take Eliquis exactly as prescribed by your doctor and DO NOT stop taking Eliquis without talking to the doctor who prescribed the medication.  Stopping without other medication to take the place of Eliquis may increase your risk of developing a clot.  After discharge, you should have regular check-up appointments with your healthcare provider that is prescribing your Eliquis.  What do you do if you miss a dose? If a dose of ELIQUIS is not taken at the scheduled time, take it as soon as possible on the same day and twice-daily administration should be resumed.  The dose should not be doubled to make up for a missed dose.  Do not take more than one tablet of ELIQUIS at the same time.  Important Safety Information A possible side effect of Eliquis is bleeding. You should call your healthcare provider right away if you experience any of the following: ? Bleeding from an injury or your nose that does not stop. ? Unusual colored urine  (red or dark brown) or unusual colored stools (red or black). ? Unusual bruising for unknown reasons. ? A serious fall or if you hit your head (even if there is no bleeding).  Some medicines may interact with Eliquis and might increase your risk of bleeding or clotting while on Eliquis. To help avoid this, consult your healthcare provider or pharmacist prior to using any new prescription or non-prescription medications, including herbals, vitamins, non-steroidal anti-inflammatory drugs (NSAIDs) and supplements.  This website has more information on Eliquis (apixaban): http://www.eliquis.com/eliquis/home

## 2020-02-28 NOTE — Evaluation (Signed)
Occupational Therapy Evaluation Patient Details Name: Tamasha Laplante MRN: [id number] DOB: [birth year] Today's Date: 02/28/2020    History of Present Illness s/p L TKA revision. PMH: HTN, DM, L TKA, arthritis   Clinical Impression   Ms. 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 Patient exhibits decreased ROM and strength in left lower extremity, reports of pain with activity, impaired balance and decreased activity tolerance resulting in impaired ability to perform independent ADLs and mobility. Patient reports living with a roommate that will not be a caregiver and having 7-8 questionable steps to get into her home. At this time patient needs assistance with mobility and ADLs and is not safe to return home without 24/7 assistance or attempt steps of poor quality. Patient does state she might be able to go home with her daughter - and this is a preferred discharge plan. If unable to go home with daughter would recommend short term rehab.    Follow Up Recommendations  Home health OT;SNF    Equipment Recommendations  3 in 1 bedside commode    Recommendations for Other Services       Precautions / Restrictions Precautions Precautions: Fall;Knee Restrictions Weight Bearing Restrictions: Yes LLE Weight Bearing: Touchdown weight bearing Other Position/Activity Restrictions: no ROM restrictions      Mobility Bed Mobility Overal bed mobility: Needs Assistance Bed Mobility: Supine to Sit     Supine to sit: Min assist     General bed mobility comments: min assist for LLE to transfer to edge of bed  Transfers Overall transfer level: Needs assistance Equipment used: Rolling walker (2 wheeled) Transfers: Sit to/from [employer] Transfers Sit to Stand: Min assist;Min guard Stand pivot transfers: Min assist;Min guard       General transfer comment: cues for hand placement and LLE position/TDWB    Balance Overall balance assessment:  Needs assistance Sitting-balance support: No upper extremity supported;Feet supported Sitting balance-Leahy Scale: Good     Standing balance support: Single extremity supported;Bilateral upper extremity supported;During functional activity Standing balance-Leahy Scale: Poor Standing balance comment: reliant on at least unilateral UE support. LOB x1 while donning 2nd gown in  standing                           ADL either performed or assessed with clinical judgement   ADL Overall ADL's : Needs assistance/impaired Eating/Feeding: Independent   Grooming: Sitting;Wash/dry face;Wash/dry hands;Set up   Upper Body Bathing: Sitting;Set up   Lower Body Bathing: Moderate assistance;Sit to/from stand;Set up   Upper Body Dressing : Set up;Sitting   Lower Body Dressing: Sit to/from stand;Maximal assistance;Set up   Toilet Transfer: BSC;Stand-pivot;Moderate assistance   Toileting- Clothing Manipulation and Hygiene: Minimal assistance;Sit to/from stand Toileting - Clothing Manipulation Details (indicate cue type and reason): Patient able to wipe self - needs assistane for clothing management.     Functional mobility during ADLs: Minimal assistance;Rolling walker       Vision   Vision Assessment?: No apparent visual deficits     Perception     Praxis      Pertinent Vitals/Pain Pain Assessment: Faces Faces Pain Scale: Hurts little more Pain Location: L knee with activity - after ambulation. Pain Descriptors / Indicators: Discomfort;Grimacing Pain Intervention(s): Limited activity within patient's tolerance;Monitored during session;Premedicated before session     Hand Dominance Right   Extremity/Trunk Assessment Upper Extremity Assessment Upper Extremity Assessment: Overall WFL for tasks assessed  Lower Extremity Assessment Lower Extremity Assessment: Defer to PT evaluation LLE Deficits / Details: knee AAROM grossly 8 to 60 degrees flexion. strength 2+/5    Cervical / Trunk Assessment Cervical / Trunk Assessment: Normal   Communication Communication Communication: No difficulties   Cognition Arousal/Alertness: Awake/alert Behavior During Therapy: WFL for tasks assessed/performed Overall Cognitive Status: Within Functional Limits for tasks assessed                                     General Comments       Exercises Total Joint Exercises Ankle Circles/Pumps: AROM;Both;5 reps Heel Slides: Other (comment);AAROM;Left (7 reps)   Shoulder Instructions      Home Living Family/patient expects to be discharged to:: Private residence Living Arrangements: Non-relatives/Friends   Type of Home: Mobile home Home Access: Stairs to enter [employer] of Steps: 7-8 Entrance Stairs-Rails: Left;Right Home Layout: One level     Bathroom Shower/Tub: Chief Strategy Officer: Standard Bathroom Accessibility: No   Home Equipment: Environmental consultant - 2 wheels;Bedside commode   Additional Comments: pt reports she is considering d/c to dtr's home (one level withno stairs)      Prior Functioning/Environment Level of Independence: Independent with assistive device(s)                 OT Problem List: Decreased strength;Decreased activity tolerance;Impaired balance (sitting and/or standing);Decreased knowledge of use of DME or AE;Pain      OT Treatment/Interventions: Self-care/ADL training;Therapeutic exercise;DME and/or AE instruction;Patient/family education;Balance training;Therapeutic activities    OT Goals(Current goals can be found in the care plan section) Acute Rehab OT Goals Patient Stated Goal: get better OT Goal Formulation: With patient Time For Goal Achievement: 03/13/20 Potential to Achieve Goals: Good  OT Frequency: Min 2X/week   Barriers to D/C: Inaccessible home environment;Decreased caregiver support          Co-evaluation PT/OT/SLP Co-Evaluation/Treatment: Yes Reason for Co-Treatment:  For patient/therapist safety;To address functional/ADL transfers PT goals addressed during session: Mobility/safety with mobility OT goals addressed during session: ADL's and self-care      AM-PAC OT "6 Clicks" Daily Activity     Outcome Measure   Help from another person taking care of personal grooming?: A Little Help from another person toileting, which includes using toliet, bedpan, or urinal?: A Lot Help from another person bathing (including washing, rinsing, drying)?: A Lot Help from another person to put on and taking off regular upper body clothing?: A Little Help from another person to put on and taking off regular lower body clothing?: A Lot 6 Click Score: 12   End of Session Equipment Utilized During Treatment: Gait belt;Rolling walker Nurse Communication: Mobility status  Activity Tolerance:   Patient left: in chair;with call bell/phone within reach;with chair alarm set  OT Visit Diagnosis: Other abnormalities of gait and mobility (R26.89);Unsteadiness on feet (R26.81);Pain Pain - Right/Left: Left Pain - part of body: Knee                Time: 8889-1694 OT Time Calculation (min): 30 min Charges:  OT General Charges $OT Visit: 1 Visit OT Evaluation $OT Eval Low Complexity: 1 Low  Deltha Bernales, OTR/L Acute Care Rehab Services  Office [phone] Pager: [phone]   Kelli Churn 02/28/2020, 12:56 PM

## 2020-02-29 DIAGNOSIS — T84093A Other mechanical complication of internal left knee prosthesis, initial encounter: Secondary | ICD-10-CM | POA: Diagnosis not present

## 2020-02-29 LAB — CBC
HCT: 23.7 % — ABNORMAL LOW (ref 36.0–46.0)
Hemoglobin: 7.7 g/dL — ABNORMAL LOW (ref 12.0–15.0)
MCH: 29.1 pg (ref 26.0–34.0)
MCHC: 32.5 g/dL (ref 30.0–36.0)
MCV: 89.4 fL (ref 80.0–100.0)
Platelets: 209 10*3/uL (ref 150–400)
RBC: 2.65 MIL/uL — ABNORMAL LOW (ref 3.87–5.11)
RDW: 12.7 % (ref 11.5–15.5)
WBC: 11.6 10*3/uL — ABNORMAL HIGH (ref 4.0–10.5)
nRBC: 0 % (ref 0.0–0.2)

## 2020-02-29 NOTE — Progress Notes (Signed)
Physical Therapy Treatment Patient Details Name: Alexandria Gibbs MRN: [id number] DOB: 1946-03-27 Today's Date: 02/29/2020    History of Present Illness s/p L TKA revision. PMH: HTN, DM, L TKA, arthritis    PT Comments    Exercise focused session this pm. Discussed tub DME with pt dtr as they had some questions--defer further discussion to OT. Pt is progressing with LLE strength and ROM   Follow Up Recommendations  Follow surgeon's recommendation for DC plan and follow-up therapies;Supervision for mobility/OOB (HHPT )     Equipment Recommendations  None recommended by PT    Recommendations for Other Services       Precautions / Restrictions Precautions Precautions: Fall;Knee Restrictions LLE Weight Bearing: Touchdown weight bearing Other Position/Activity Restrictions: no ROM restrictions    Mobility  Bed Mobility                  Transfers                    Ambulation/Gait                 Stairs             Wheelchair Mobility    Modified Rankin (Stroke Patients Only)       Balance                                            Cognition Arousal/Alertness: Awake/alert Behavior During Therapy: WFL for tasks assessed/performed Overall Cognitive Status: Within Functional Limits for tasks assessed                                        Exercises Total Joint Exercises Ankle Circles/Pumps: AROM;Both;15 reps Quad Sets: AROM;Both;10 reps Short Arc Quad: AROM;AAROM;Left;10 reps Heel Slides: AAROM;Left;10 reps Hip ABduction/ADduction: AAROM;Left;10 reps Straight Leg Raises: Left;10 reps;AAROM Goniometric ROM: grossly 8 to 60 degrees AAROm L knee flexion    General Comments        Pertinent Vitals/Pain Pain Assessment: 0-10 Faces Pain Scale: Hurts little more Pain Location: L knee with activity - after ambulation. Pain Descriptors / Indicators: Discomfort;Grimacing Pain Intervention(s):  Limited activity within patient's tolerance;Monitored during session;RN gave pain meds during session    Home Living                      Prior Function            PT Goals (current goals can now be found in the care plan section) Acute Rehab PT Goals Patient Stated Goal: get better PT Goal Formulation: With patient Time For Goal Achievement: 03/06/20 Potential to Achieve Goals: Good Progress towards PT goals: Progressing toward goals    Frequency    7X/week      PT Plan Current plan remains appropriate    Co-evaluation              AM-PAC PT "6 Clicks" Mobility   Outcome Measure  Help needed turning from your back to your side while in a flat bed without using bedrails?: A Little Help needed moving from lying on your back to sitting on the side of a flat bed without using bedrails?: A Little Help needed moving to and from a bed to a chair (including a wheelchair)?: A  Little Help needed standing up from a chair using your arms (e.g., wheelchair or bedside chair)?: A Little Help needed to walk in hospital room?: A Little Help needed climbing 3-5 steps with a railing? : A Little 6 Click Score: 18    End of Session   Activity Tolerance: Patient tolerated treatment well Patient left: with call bell/phone within reach;in bed;with chair alarm set;with family/visitor present Nurse Communication: Mobility status PT Visit Diagnosis: Difficulty in walking, not elsewhere classified (R26.2)     Time: 3825-0539 PT Time Calculation (min) (ACUTE ONLY): 15 min  Charges:  $Therapeutic Exercise: 8-22 mins                     Delice Bison, PT  Acute Rehab Dept ([facility]) [phone] Pager [phone]  02/29/2020    [facility] 02/29/2020, 3:53 PM

## 2020-02-29 NOTE — Plan of Care (Signed)
  Problem: Education: Goal: Required Educational Video(s) Outcome: Progressing   Problem: Clinical Measurements: Goal: Ability to maintain clinical measurements within normal limits will improve Outcome: Progressing Goal: Postoperative complications will be avoided or minimized Outcome: Progressing   Problem: Skin Integrity: Goal: Demonstration of wound healing without infection will improve Outcome: Progressing   Problem: Education: Goal: Knowledge of General Education information will improve Description: Including pain rating scale, medication(s)/side effects and non-pharmacologic comfort measures Outcome: Progressing   Problem: Health Behavior/Discharge Planning: Goal: Ability to manage health-related needs will improve Outcome: Progressing   Problem: Clinical Measurements: Goal: Ability to maintain clinical measurements within normal limits will improve Outcome: Progressing Goal: Will remain free from infection Outcome: Progressing Goal: Diagnostic test results will improve Outcome: Progressing Goal: Cardiovascular complication will be avoided Outcome: Progressing   Problem: Activity: Goal: Risk for activity intolerance will decrease Outcome: Progressing   Problem: Nutrition: Goal: Adequate nutrition will be maintained Outcome: Progressing   Problem: Coping: Goal: Level of anxiety will decrease Outcome: Progressing   Problem: Elimination: Goal: Will not experience complications related to bowel motility Outcome: Progressing Goal: Will not experience complications related to urinary retention Outcome: Progressing   Problem: Pain Managment: Goal: General experience of comfort will improve Outcome: Progressing   Problem: Safety: Goal: Ability to remain free from injury will improve Outcome: Progressing   Problem: Skin Integrity: Goal: Risk for impaired skin integrity will decrease Outcome: Progressing

## 2020-02-29 NOTE — Progress Notes (Signed)
Subjective:  Patient reports pain as mild to moderate.  Denies N/V/CP/SOB.   Objective:   VITALS:   Vitals:   02/28/20 0506 02/28/20 1452 02/28/20 2103 02/29/20 0605  BP: (!) 121/55 (!) 125/53 (!) 133/59 120/63  Pulse: 64 72 66 62  Resp: 17 16 16 16   Temp: 98.2 F (36.8 C) (!) 97.1 F (36.2 C) 98 F (36.7 C) 98.2 F (36.8 C)  TempSrc:   Oral Oral  SpO2: 98% 98% 100% 99%  Weight:      Height:       HV 500 cc since surgery  NAD ABD soft Sensation intact distally Intact pulses distally Dorsiflexion/Plantar flexion intact Incision: dressing C/D/I Compartment soft iVAC intact   Lab Results  Component Value Date   WBC 11.6 (H) 02/29/2020   HGB 7.7 (L) 02/29/2020   HCT 23.7 (L) 02/29/2020   MCV 89.4 02/29/2020   PLT 209 02/29/2020   BMET    Component Value Date/Time   NA 135 02/28/2020 0239   K 4.3 02/28/2020 0239   CL 102 02/28/2020 0239   CO2 24 02/28/2020 0239   GLUCOSE 121 (H) 02/28/2020 0239   BUN 23 02/28/2020 0239   CREATININE 1.03 (H) 02/28/2020 0239   CALCIUM 8.1 (L) 02/28/2020 0239   GFRNONAA 55 (L) 02/28/2020 0239   GFRAA >60 02/28/2020 0239   Recent Results (from the past 240 hour(s))  Surgical pcr screen     Status: None   Collection Time: 02/24/20  1:45 PM   Specimen: Nasal Mucosa; Nasal Swab  Result Value Ref Range Status   MRSA, PCR NEGATIVE NEGATIVE Final   Staphylococcus aureus NEGATIVE NEGATIVE Final    Comment: (NOTE) The Xpert SA Assay (FDA approved for NASAL specimens in patients 6 years of age and older), is one component of a comprehensive surveillance program. It is not intended to diagnose infection nor to guide or monitor treatment. Performed at [facility], [address] [place], [place]   SARS CORONAVIRUS 2 (TAT 6-24 HRS) Nasopharyngeal Nasopharyngeal Swab     Status: None   Collection Time: 02/24/20  2:29 PM   Specimen: Nasopharyngeal Swab  Result Value Ref Range Status   SARS  Coronavirus 2 NEGATIVE NEGATIVE Final    Comment: (NOTE) SARS-CoV-2 target nucleic acids are NOT DETECTED.  The SARS-CoV-2 RNA is generally detectable in upper and lower respiratory specimens during the acute phase of infection. Negative results do not preclude SARS-CoV-2 infection, do not rule out co-infections with other pathogens, and should not be used as the sole basis for treatment or other patient management decisions. Negative results must be combined with clinical observations, patient history, and epidemiological information. The expected result is Negative.  Fact Sheet for Patients: 02/26/20  Fact Sheet for Healthcare Providers: HairSlick.no  This test is not yet approved or cleared by the quierodirigir.com FDA and  has been authorized for detection and/or diagnosis of SARS-CoV-2 by FDA under an Emergency Use Authorization (EUA). This EUA will remain  in effect (meaning this test can be used) for the duration of the COVID-19 declaration under Se ction 564(b)(1) of the Act, 21 U.S.C. section 360bbb-3(b)(1), unless the authorization is terminated or revoked sooner.  Performed at [facility], [address], [place], [place]   Body fluid culture     Status: None (Preliminary result)   Collection Time: 02/27/20  8:23 AM   Specimen: Synovial, Left Knee; Body Fluid  Result Value Ref  Range Status   Specimen Description   Final    Synov, Knee Performed at [facility], [address], [place], [place] [address]    Special Requests   Final    NONE Performed at [facility], [address], [place], [place] [address]    Gram Stain   Final    RARE WBC PRESENT,BOTH PMN AND MONONUCLEAR NO ORGANISMS SEEN Gram Stain Report Called to,Read Back By and Verified With: Lamount Cranker RN @0954  ON 8.19.2021 MECIAL,J.  Performed at [facility], [address], [place], [place]    Culture   Final    NO GROWTH < 24 HOURS Performed at [facility], [address], [place], [place]    Report Status PENDING  Incomplete  Aerobic/Anaerobic Culture (surgical/deep wound)     Status: None (Preliminary result)   Collection Time: 02/27/20  9:01 AM   Specimen: Joint, Other; Body Fluid  Result Value Ref Range Status   Specimen Description   Final    KNEE LEFT Performed at [facility], [address] [place], [place]    Special Requests   Final    NONE Performed at [facility], [address] [place], [place]    Gram Stain   Final    RARE WBC PRESENT,BOTH PMN AND MONONUCLEAR NO ORGANISMS SEEN Gram Stain Report Called to,Read Back By and Verified With: YOUNG,F. RN @1024  ON 8.19.2021 BY [facility] Performed at [facility], [address] [place], [place]    Culture   Final    NO GROWTH < 24 HOURS Performed at [facility], [address], [place], [address] [place]    Report Status PENDING  Incomplete      Assessment/Plan: 2 Days Post-Op   Principal Problem:   Failed total knee, left (HCC) Active Problems:   Status post revision of total knee replacement, left   TDWB LLE with walker OK for ROM L knee DVT ppx: apixaban, SCDs, TEDS PO pain control PT/OT HV drain removed Dispo:D/C planning, upon d/c home convert house [facility] unit to portable Pre[facility] unit (@ bedside)    [id number] 02/29/2020, 8:29 AM [facility] is now 03/02/2020 [address], [address], [place], [address] [place] Phone: [phone] [email or web address] Facebook  [phone]

## 2020-02-29 NOTE — Plan of Care (Signed)
Re-educated patient on use of incentive spirometer and importance of it to help prevent certain post operative complications. Ceasar Lund RN

## 2020-02-29 NOTE — Progress Notes (Signed)
Physical Therapy Treatment Patient Details Name: Alexandria Gibbs MRN: [id number] DOB: 08-10-1945 Today's Date: 02/29/2020    History of Present Illness s/p L TKA revision. PMH: HTN, DM, L TKA, arthritis    PT Comments    PT reports feeling " not so good" today. decr amb distance, fatigued after ~ 12' requiring seated rest, pt returned to bed per her request. Will continue to follow   Follow Up Recommendations  Follow surgeon's recommendation for DC plan and follow-up therapies;Supervision for mobility/OOB (HHPT )     Equipment Recommendations  None recommended by PT    Recommendations for Other Services       Precautions / Restrictions Precautions Precautions: Fall;Knee Restrictions LLE Weight Bearing: Touchdown weight bearing Other Position/Activity Restrictions: no ROM restrictions    Mobility  Bed Mobility Overal bed mobility: Needs Assistance Bed Mobility: Supine to Sit;Sit to Supine     Supine to sit: Min assist Sit to supine: Min guard   General bed mobility comments: min assist for LLE transition to edge of bed, min/guard using  gait belt as leg lifter on return to supine   Transfers Overall transfer level: Needs assistance Equipment used: Rolling walker (2 wheeled) Transfers: Sit to/from [employer] Sit to Stand: Min guard Stand pivot transfers: Min guard       General transfer comment: cues for hand placement and LLE position/TDWB  Ambulation/Gait Ambulation/Gait assistance: Min assist;Min guard Gait Distance (Feet): 12 Feet Assistive device: Rolling walker (2 wheeled) Gait Pattern/deviations: Step-to pattern Gait velocity: decr   General Gait Details: cues for sequence, TDWB, RW position and overall safety. fatigues qucikly today requiring seated rest    Stairs             Wheelchair Mobility    Modified Rankin (Stroke Patients Only)       Balance             Standing balance-Leahy Scale: Poor Standing  balance comment: reliant on at least unilateral UE support                            Cognition Arousal/Alertness: Awake/alert Behavior During Therapy: WFL for tasks assessed/performed Overall Cognitive Status: Within Functional Limits for tasks assessed                                        Exercises Total Joint Exercises Ankle Circles/Pumps: AROM;Both;15 reps    General Comments        Pertinent Vitals/Pain Faces Pain Scale: Hurts little more Pain Location: L knee with activity - after ambulation. Pain Descriptors / Indicators: Discomfort;Grimacing Pain Intervention(s): Limited activity within patient's tolerance;Monitored during session;Premedicated before session;Patient requesting pain meds-RN notified;Ice applied    Home Living                      Prior Function            PT Goals (current goals can now be found in the care plan section) Acute Rehab PT Goals Patient Stated Goal: get better PT Goal Formulation: With patient Time For Goal Achievement: 03/06/20 Potential to Achieve Goals: Good Progress towards PT goals: Progressing toward goals    Frequency    7X/week      PT Plan Current plan remains appropriate    Co-evaluation  AM-PAC PT "6 Clicks" Mobility   Outcome Measure  Help needed turning from your back to your side while in a flat bed without using bedrails?: A Little Help needed moving from lying on your back to sitting on the side of a flat bed without using bedrails?: A Little Help needed moving to and from a bed to a chair (including a wheelchair)?: A Little Help needed standing up from a chair using your arms (e.g., wheelchair or bedside chair)?: A Little Help needed to walk in hospital room?: A Little Help needed climbing 3-5 steps with a railing? : A Little 6 Click Score: 18    End of Session Equipment Utilized During Treatment: Gait belt Activity Tolerance: Patient tolerated  treatment well Patient left: with call bell/phone within reach;in bed;with chair alarm set;with family/visitor present Nurse Communication: Mobility status PT Visit Diagnosis: Difficulty in walking, not elsewhere classified (R26.2)     Time: 7096-2836 PT Time Calculation (min) (ACUTE ONLY): 18 min  Charges:  $Gait Training: 8-22 mins                     Delice Bison, PT  Acute Rehab Dept ([facility]) [phone] Pager [phone]  02/29/2020    [facility] 02/29/2020, 11:38 AM

## 2020-03-01 DIAGNOSIS — T84093A Other mechanical complication of internal left knee prosthesis, initial encounter: Secondary | ICD-10-CM | POA: Diagnosis not present

## 2020-03-01 LAB — CBC
HCT: 24.1 % — ABNORMAL LOW (ref 36.0–46.0)
Hemoglobin: 7.7 g/dL — ABNORMAL LOW (ref 12.0–15.0)
MCH: 29.1 pg (ref 26.0–34.0)
MCHC: 32 g/dL (ref 30.0–36.0)
MCV: 90.9 fL (ref 80.0–100.0)
Platelets: 218 10*3/uL (ref 150–400)
RBC: 2.65 MIL/uL — ABNORMAL LOW (ref 3.87–5.11)
RDW: 13.2 % (ref 11.5–15.5)
WBC: 9 10*3/uL (ref 4.0–10.5)
nRBC: 0 % (ref 0.0–0.2)

## 2020-03-01 LAB — BODY FLUID CULTURE: Culture: NO GROWTH

## 2020-03-01 MED ORDER — HYDROCODONE-ACETAMINOPHEN 5-325 MG PO TABS
1.0000 | ORAL_TABLET | ORAL | 0 refills | Status: AC | PRN
Start: 2020-03-01 — End: ?

## 2020-03-01 NOTE — Plan of Care (Signed)
Care plans complete, patient discharged.

## 2020-03-01 NOTE — Progress Notes (Signed)
Both patient and daughter verbalized understanding of all dc instructions and medications, both aware that home health is set up and given the organization and contact number in the instructions. Medications were transferred to pharmacy closer to their house and verified ready to pick up. Patient and daughter both educated and verbalized understanding of portable home wound vac. All belongings sent home with both.

## 2020-03-01 NOTE — TOC Progression Note (Signed)
Transition of Care [facility]) - Progression Note    Patient Details  Name: Alexandria Gibbs MRN: [id number] Date of Birth: [birth year]  Transition of Care [facility]) CM/SW Contact  Armanda Heritage, RN Phone Number: 03/01/2020, 2:25 PM  Clinical Narrative:    CM received notification patient is in need of HHPT services.  Patient set up with [facility] for HHPT.     Expected Discharge Plan: Home/Self Care Barriers to Discharge: No Barriers Identified  Expected Discharge Plan and Services Expected Discharge Plan: Home/Self Care         Expected Discharge Date: 03/01/20               DME Arranged: 3-N-1, Walker rolling, Shower stool DME Agency: [employer], [employer]     Representative spoke with at DME Agency: spoke with Harrold Donath with [employer] who supplied rw and 3n1;  spoke with Jenness Corner with [employer] who supplied tub seat HH Arranged: PT HH Agency: [facility] Date HH Agency Contacted: 03/01/20 Time HH Agency Contacted: 1422 Representative spoke with at [facility] Agency: Barbara Cower   Social Determinants of Health (SDOH) Interventions    Readmission Risk Interventions No flowsheet data found.

## 2020-03-01 NOTE — Progress Notes (Addendum)
Alexandria Gibbs is a 74 y.o. female patient. 1. Failed total knee, left [facility])    Past Medical History:  Diagnosis Date  . Anemia   . Arthritis   . Diabetes mellitus without complication (HCC)   . GERD (gastroesophageal reflux disease)   . Headache   . History of kidney stones   . Hypertension   . Sleep apnea    Current Facility-Administered Medications  Medication Dose Route Frequency Provider Last Rate Last Admin  . 0.9 %  sodium chloride infusion   Intravenous Continuous Darrick Grinder, [place]   Paused at 02/28/20 1314  . acetaminophen (TYLENOL) tablet 325-650 mg  325-650 mg Oral Q6H PRN Barrie Dunker B, PA      . alum & mag hydroxide-simeth (MAALOX/MYLANTA) 200-200-20 MG/5ML suspension 30 mL  30 mL Oral Q4H PRN Barrie Dunker B, PA      . apixaban (ELIQUIS) tablet 2.5 mg  2.5 mg Oral Q12H Barrie Dunker B, PA   2.5 mg at 02/29/20 1944  . diphenhydrAMINE (BENADRYL) 12.5 MG/5ML elixir 12.5-25 mg  12.5-25 mg Oral Q4H PRN Barrie Dunker B, PA   12.5 mg at 02/29/20 1501  . docusate sodium (COLACE) capsule 100 mg  100 mg Oral BID Barrie Dunker B, PA   100 mg at 02/29/20 1944  . gabapentin (NEURONTIN) capsule 100 mg  100 mg Oral Daily Samson Frederic, MD   100 mg at 02/29/20 0846  . HYDROcodone-acetaminophen (NORCO) 7.5-325 MG per tablet 1-2 tablet  1-2 tablet Oral Q4H PRN Barrie Dunker B, PA   2 tablet at 03/01/20 0530  . HYDROcodone-acetaminophen (NORCO/VICODIN) 5-325 MG per tablet 1-2 tablet  1-2 tablet Oral Q4H PRN Barrie Dunker B, PA   2 tablet at 02/28/20 1733  . loratadine (CLARITIN) tablet 10 mg  10 mg Oral Daily Samson Frederic, MD   10 mg at 02/29/20 0845  . menthol-cetylpyridinium (CEPACOL) lozenge 3 mg  1 lozenge Oral PRN Barrie Dunker B, PA       Or  . phenol (CHLORASEPTIC) mouth spray 1 spray  1 spray Mouth/Throat PRN Barrie Dunker B, PA      . methocarbamol (ROBAXIN) tablet 500 mg  500 mg Oral Q6H PRN Barrie Dunker B, PA   500 mg at 03/01/20 0530   Or  .  methocarbamol (ROBAXIN) 500 mg in dextrose 5 % 50 mL IVPB  500 mg Intravenous Q6H PRN Barrie Dunker B, PA      . metoCLOPramide (REGLAN) tablet 5-10 mg  5-10 mg Oral Q8H PRN Barrie Dunker B, PA       Or  . metoCLOPramide (REGLAN) injection 5-10 mg  5-10 mg Intravenous Q8H PRN Barrie Dunker B, PA      . montelukast (SINGULAIR) tablet 10 mg  10 mg Oral QHS Samson Frederic, MD   10 mg at 02/29/20 1944  . morphine 2 MG/ML injection 0.5-1 mg  0.5-1 mg Intravenous Q2H PRN Barrie Dunker B, PA   0.5 mg at 02/27/20 1539  . ondansetron (ZOFRAN) tablet 4 mg  4 mg Oral Q6H PRN Barrie Dunker B, PA       Or  . ondansetron (ZOFRAN) injection 4 mg  4 mg Intravenous Q6H PRN Barrie Dunker B, PA      . pantoprazole (PROTONIX) EC tablet 40 mg  40 mg Oral Daily Samson Frederic, MD   40 mg at 02/29/20 0845  . polyethylene glycol (MIRALAX / GLYCOLAX) packet 17 g  17 g Oral Daily PRN Darrick Grinder, PA      .  pravastatin (PRAVACHOL) tablet 40 mg  40 mg Oral q1800 Samson Frederic, MD   40 mg at 02/29/20 1757   Allergies  Allergen Reactions  . Peach [Prunus Persica] Swelling  . Sulfa Antibiotics Itching and Swelling   Principal Problem:   Failed total knee, left (HCC) Active Problems:   Status post revision of total knee replacement, left  Blood pressure (!) 128/51, pulse 71, temperature 97.8 F (36.6 C), temperature source Oral, resp. rate 18, height 5\' 5"  (1.651 m), weight 99.8 kg, SpO2 100 %.  Subjective: Symptoms:  Stable.   Diet:  Adequate intake.   Activity level: Normal.   Pain:  She complains of pain that is mild.  She reports pain is improving.  Pain is well controlled.    Objective: General Appearance:  Comfortable.   Vital signs: (most recent): Blood pressure (!) 128/51, pulse 71, temperature 97.8 F (36.6 C), temperature source Oral, resp. rate 18, height 5\' 5"  (1.651 m), weight 99.8 kg, SpO2 100 %.  Vital signs are normal.   Lungs:  Normal effort.   Heart: Normal rate.    Pulses: There are decreased pulses.   Skin:  Warm.   hgb 7.7   Assessment:  Condition: In stable condition.  Improving.   (Doing well. Convert to OPT VAC when d/c'd. Dressing C/D/I. Asmptomatic anemia Hgb stable.).   Plan:  Discharge home.  Regular diet.   when arranged home PT etc.  03/01/2020

## 2020-03-01 NOTE — Plan of Care (Signed)
Continuing to improve patient knowledge of general education by using teach-back method. Ceasar Lund, RN

## 2020-03-01 NOTE — Progress Notes (Signed)
Physical Therapy Treatment Patient Details Name: Alexandria Gibbs MRN: [id number] DOB: Jun 12, 1946 Today's Date: 03/01/2020    History of Present Illness s/p L TKA revision. PMH: HTN, DM, L TKA, arthritis    PT Comments    Pt progressing well. incr amb distance. Supportive dtr present for PT session. Pt ready to d/c home with dtr from PT standpoint.    Follow Up Recommendations  Follow surgeon's recommendation for DC plan and follow-up therapies;Supervision for mobility/OOB     Equipment Recommendations  None recommended by PT    Recommendations for Other Services       Precautions / Restrictions Precautions Precautions: Fall;Knee Restrictions LLE Weight Bearing: Touchdown weight bearing    Mobility  Bed Mobility         Supine to sit: Supervision     General bed mobility comments: pt using gait belt as leg lifter   Transfers Overall transfer level: Needs assistance Equipment used: Rolling walker (2 wheeled) Transfers: Sit to/from Stand Sit to Stand: Supervision         General transfer comment: cues for hand placement and LLE position/TDWB  Ambulation/Gait Ambulation/Gait assistance: Supervision;Min guard Gait Distance (Feet): 40 Feet Assistive device: Rolling walker (2 wheeled) Gait Pattern/deviations: Step-to pattern     General Gait Details: supervision to min/guard for safety. slow but steady gait, no LOB, maintains TDWB    Stairs             Wheelchair Mobility    Modified Rankin (Stroke Patients Only)       Balance                                            Cognition Arousal/Alertness: Awake/alert Behavior During Therapy: WFL for tasks assessed/performed Overall Cognitive Status: Within Functional Limits for tasks assessed                                        Exercises      General Comments        Pertinent Vitals/Pain Pain Assessment: 0-10 Pain Location: L knee with activity - after  ambulation. Pain Descriptors / Indicators: Discomfort;Grimacing Pain Intervention(s): Limited activity within patient's tolerance;Monitored during session;Premedicated before session;Repositioned    Home Living                      Prior Function            PT Goals (current goals can now be found in the care plan section) Acute Rehab PT Goals Patient Stated Goal: get better PT Goal Formulation: With patient Time For Goal Achievement: 03/06/20 Potential to Achieve Goals: Good Progress towards PT goals: Progressing toward goals    Frequency    7X/week      PT Plan Current plan remains appropriate    Co-evaluation              AM-PAC PT "6 Clicks" Mobility   Outcome Measure  Help needed turning from your back to your side while in a flat bed without using bedrails?: None Help needed moving from lying on your back to sitting on the side of a flat bed without using bedrails?: None Help needed moving to and from a bed to a chair (including a wheelchair)?: A Little Help needed standing up  from a chair using your arms (e.g., wheelchair or bedside chair)?: A Little Help needed to walk in hospital room?: A Little Help needed climbing 3-5 steps with a railing? : A Little 6 Click Score: 20    End of Session Equipment Utilized During Treatment: Gait belt Activity Tolerance: Patient tolerated treatment well Patient left: with call bell/phone within reach;in chair;with chair alarm set;with family/visitor present   PT Visit Diagnosis: Difficulty in walking, not elsewhere classified (R26.2)     Time: 1245-8099 PT Time Calculation (min) (ACUTE ONLY): 27 min  Charges:  $Gait Training: 8-22 mins $Therapeutic Exercise: 8-22 mins                     Delice Bison, PT  Acute Rehab Dept ([facility]) [phone] Pager [phone]  03/01/2020    [facility] 03/01/2020, 11:40 AM

## 2020-03-02 ENCOUNTER — Encounter (HOSPITAL_COMMUNITY): Payer: Self-pay | Admitting: Orthopedic Surgery

## 2020-03-03 LAB — AEROBIC/ANAEROBIC CULTURE W GRAM STAIN (SURGICAL/DEEP WOUND): Culture: NO GROWTH

## 2020-03-12 ENCOUNTER — Emergency Department (HOSPITAL_BASED_OUTPATIENT_CLINIC_OR_DEPARTMENT_OTHER)
Admission: EM | Admit: 2020-03-12 | Discharge: 2020-03-12 | Disposition: A | Discharge status: Home / Self Care | Payer: 59 | Attending: Emergency Medicine | Admitting: Emergency Medicine

## 2020-03-12 ENCOUNTER — Other Ambulatory Visit: Payer: Self-pay

## 2020-03-12 ENCOUNTER — Encounter (HOSPITAL_BASED_OUTPATIENT_CLINIC_OR_DEPARTMENT_OTHER): Payer: Self-pay

## 2020-03-12 DIAGNOSIS — Z7901 Long term (current) use of anticoagulants: Secondary | ICD-10-CM | POA: Insufficient documentation

## 2020-03-12 DIAGNOSIS — M79605 Pain in left leg: Secondary | ICD-10-CM | POA: Diagnosis present

## 2020-03-12 DIAGNOSIS — Z79899 Other long term (current) drug therapy: Secondary | ICD-10-CM | POA: Diagnosis not present

## 2020-03-12 DIAGNOSIS — E119 Type 2 diabetes mellitus without complications: Secondary | ICD-10-CM | POA: Insufficient documentation

## 2020-03-12 DIAGNOSIS — R21 Rash and other nonspecific skin eruption: Secondary | ICD-10-CM | POA: Diagnosis not present

## 2020-03-12 DIAGNOSIS — I10 Essential (primary) hypertension: Secondary | ICD-10-CM | POA: Insufficient documentation

## 2020-03-12 DIAGNOSIS — Z96653 Presence of artificial knee joint, bilateral: Secondary | ICD-10-CM | POA: Diagnosis not present

## 2020-03-12 DIAGNOSIS — G8918 Other acute postprocedural pain: Secondary | ICD-10-CM | POA: Insufficient documentation

## 2020-03-12 DIAGNOSIS — R509 Fever, unspecified: Secondary | ICD-10-CM | POA: Insufficient documentation

## 2020-03-12 LAB — COMPREHENSIVE METABOLIC PANEL
ALT: 15 U/L (ref 0–44)
AST: 21 U/L (ref 15–41)
Albumin: 3.6 g/dL (ref 3.5–5.0)
Alkaline Phosphatase: 53 U/L (ref 38–126)
Anion gap: 12 (ref 5–15)
BUN: 16 mg/dL (ref 8–23)
CO2: 26 mmol/L (ref 22–32)
Calcium: 9.7 mg/dL (ref 8.9–10.3)
Chloride: 95 mmol/L — ABNORMAL LOW (ref 98–111)
Creatinine, Ser: 0.9 mg/dL (ref 0.44–1.00)
GFR calc Af Amer: >60 mL/min (ref >60)
GFR calc non Af Amer: >60 mL/min (ref >60)
Glucose, Bld: 141 mg/dL — ABNORMAL HIGH (ref 70–99)
Potassium: 4.3 mmol/L (ref 3.5–5.1)
Sodium: 133 mmol/L — ABNORMAL LOW (ref 135–145)
Total Bilirubin: 0.3 mg/dL (ref 0.3–1.2)
Total Protein: 8.2 g/dL — ABNORMAL HIGH (ref 6.5–8.1)

## 2020-03-12 LAB — CBC WITH DIFFERENTIAL/PLATELET
Abs Immature Granulocytes: 0.04 10*3/uL (ref 0.00–0.07)
Basophils Absolute: 0.1 10*3/uL (ref 0.0–0.1)
Basophils Relative: 1 %
Eosinophils Absolute: 0.3 10*3/uL (ref 0.0–0.5)
Eosinophils Relative: 3 %
HCT: 29.3 % — ABNORMAL LOW (ref 36.0–46.0)
Hemoglobin: 9.6 g/dL — ABNORMAL LOW (ref 12.0–15.0)
Immature Granulocytes: 1 %
Lymphocytes Relative: 16 %
Lymphs Abs: 1.4 10*3/uL (ref 0.7–4.0)
MCH: 28.9 pg (ref 26.0–34.0)
MCHC: 32.8 g/dL (ref 30.0–36.0)
MCV: 88.3 fL (ref 80.0–100.0)
Monocytes Absolute: 0.6 10*3/uL (ref 0.1–1.0)
Monocytes Relative: 8 %
Neutro Abs: 6 10*3/uL (ref 1.7–7.7)
Neutrophils Relative %: 71 %
Platelets: 540 10*3/uL — ABNORMAL HIGH (ref 150–400)
RBC: 3.32 MIL/uL — ABNORMAL LOW (ref 3.87–5.11)
RDW: 12.9 % (ref 11.5–15.5)
WBC: 8.4 10*3/uL (ref 4.0–10.5)
nRBC: 0 % (ref 0.0–0.2)

## 2020-03-12 LAB — URINALYSIS, MICROSCOPIC (REFLEX)

## 2020-03-12 LAB — URINALYSIS, ROUTINE W REFLEX MICROSCOPIC
Bilirubin Urine: NEGATIVE
Glucose, UA: >=500 mg/dL — AB
Hgb urine dipstick: NEGATIVE
Ketones, ur: NEGATIVE mg/dL
Leukocytes,Ua: NEGATIVE
Nitrite: NEGATIVE
Protein, ur: NEGATIVE mg/dL
Specific Gravity, Urine: 1.01 (ref 1.005–1.030)
pH: 6 (ref 5.0–8.0)

## 2020-03-12 MED ORDER — CEPHALEXIN 250 MG PO CAPS: 500.0000 mg | ORAL_CAPSULE | Freq: Once | ORAL | Status: DC

## 2020-03-12 MED ORDER — HYDROCODONE-ACETAMINOPHEN 5-325 MG PO TABS
1.0000 | ORAL_TABLET | Freq: Once | ORAL | Status: AC
  Administered 2020-03-12: 1 via ORAL
  Filled 2020-03-12: qty 1

## 2020-03-12 MED ORDER — APIXABAN 2.5 MG PO TABS
2.5000 mg | ORAL_TABLET | Freq: Two times a day (BID) | ORAL | Status: DC
  Administered 2020-03-12: 2.5 mg via ORAL
  Filled 2020-03-12: qty 1

## 2020-03-12 NOTE — ED Triage Notes (Signed)
Pt arrives to ED with c/o pain to left knee and abdomen since having knee replacement on Aug 19th.

## 2020-03-12 NOTE — ED Provider Notes (Signed)
[facility] EMERGENCY DEPARTMENT Provider Note   CSN: [id number] Arrival date & time: 03/12/20  1440     History Chief Complaint  Patient presents with  . Abdominal Pain    Alexandria Gibbs is a 74 y.o. female w/ hx of left total knee replacement 2 weeks ago with Dr Veda Canning presenting to ED with left leg pain.  She reports she's had persistent left knee pain for 2 weeks since her operation.  It throbs all the time "like a tooth ache."  She reports feeling generally weak and fatigued as well.  She has chronic pain in her left shoulder but feels it has hurt more since her operation.  Her daughter questions whether the patient may have exacerbated her shoulder pain after being supinated on the OR table for her procedure.  There have been no falls or trauma since her procedure.  Her PCP prescribed augmentin as ppx 4 days ago which she has been taking.  She has an ortho appointment tomorrow with her surgeon.  No personal hx of DVT or PE.  Pt on eliquis as post-op DVT ppx and compliant with it.  HPI     Past Medical History:  Diagnosis Date  . Anemia   . Arthritis   . Diabetes mellitus without complication (HCC)   . GERD (gastroesophageal reflux disease)   . Headache   . History of kidney stones   . Hypertension   . Sleep apnea     Patient Active Problem List   Diagnosis Date Noted  . 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 ANKLE SURGERY Left    ligament repair  . CHOLECYSTECTOMY    . EYE SURGERY     cyst removal  . HAND SURGERY Right   . REPLACEMENT TOTAL KNEE BILATERAL    . TONSILLECTOMY    . TOTAL KNEE REVISION Left 02/27/2020   Procedure: TOTAL KNEE REVISION;  Surgeon: Samson Frederic, MD;  Location: [facility] ORS;  Service: Orthopedics;  Laterality: Left;  . WISDOM TOOTH EXTRACTION       OB History   No obstetric history on file.      No family history on file.  Social History   Tobacco Use  . Smoking status: Never Smoker  . Smokeless tobacco: Never Used  Vaping Use  . Vaping Use: Never used  Substance Use Topics  . Alcohol use: Not Currently  . Drug use: Not Currently    Home Medications Prior to Admission medications   Medication Sig Start Date End Date Taking? Authorizing Provider  albuterol (VENTOLIN HFA) 108 (90 Base) MCG/ACT inhaler Inhale 2 puffs into the lungs every 6 (six) hours as needed for wheezing or shortness of breath.  11/13/19   [provider]  apixaban (ELIQUIS) 2.5 MG TABS tablet Take 1 tablet (2.5 mg total) by mouth every 12 (twelve) hours. 02/28/20   Swinteck, Arlys John, MD  benazepril-hydrochlorthiazide (LOTENSIN HCT) 20-12.5 MG tablet Take 1 tablet by mouth daily.  08/21/18   [provider]  cetirizine (ZYRTEC) 10 MG tablet Take 10 mg by mouth daily.    [provider]  docusate sodium (COLACE) 100 MG capsule Take 1 capsule (100 mg total) by mouth 2 (two) times daily. 02/28/20   Swinteck, Arlys John, MD  gabapentin (NEURONTIN) 100 MG capsule Take 100 mg by mouth daily. 01/13/20   [provider]  HYDROcodone-acetaminophen (NORCO/VICODIN)  5-325 MG tablet Take 1 tablet by mouth every 4 (four) hours as needed for moderate pain (pain score 4-6). 03/01/20   Dorothy Spark, PA-C  Magnesium 250 MG TABS Take 250 mg by mouth daily.    [provider]  montelukast (SINGULAIR) 10 MG tablet Take 10 mg by mouth daily as needed (allergies).  01/13/20   [provider]  ondansetron (ZOFRAN) 4 MG tablet Take 1 tablet (4 mg total) by mouth every 6 (six) hours as needed for nausea. 02/28/20   Swinteck, Arlys John, MD  pantoprazole (PROTONIX) 40 MG tablet Take 40 mg by mouth 2 (two) times daily.  08/21/18   [provider]  pravastatin (PRAVACHOL) 40 MG tablet Take 40 mg by mouth every evening.     [provider]  psyllium (METAMUCIL) 58.6 % powder Take 1  packet by mouth daily.    [provider]  pyridOXINE (VITAMIN B-6) 100 MG tablet Take 100 mg by mouth daily.    [provider]  senna (SENOKOT) 8.6 MG TABS tablet Take 2 tablets (17.2 mg total) by mouth at bedtime. 02/28/20 04/28/20  Swinteck, Arlys John, MD  tiZANidine (ZANAFLEX) 4 MG tablet Take 4 mg by mouth 3 (three) times daily as needed for muscle spasms. 01/20/20   [provider]  TRULICITY 0.75 MG/0.5ML SOPN Inject 75 mg into the skin every 7 (seven) days. 01/08/20   [provider]  vitamin B-12 (CYANOCOBALAMIN) 1000 MCG tablet Take 1,000 mcg by mouth daily.    [provider]  Vitamin D, Ergocalciferol, (DRISDOL) 1.25 MG (50000 UNIT) CAPS capsule Take 50,000 Units by mouth every 7 (seven) days.    [provider]  XIGDUO XR 11-998 MG TB24 Take 1 tablet by mouth daily. 01/27/20   [provider]    Allergies    Peach [prunus persica] and Sulfa antibiotics  Review of Systems   Review of Systems  Constitutional: Positive for fatigue and fever.  HENT: Negative for ear pain and sore throat.   Eyes: Negative for pain and visual disturbance.  Respiratory: Negative for cough and shortness of breath.   Cardiovascular: Negative for chest pain and palpitations.  Gastrointestinal: Negative for abdominal pain and vomiting.  Genitourinary: Negative for dysuria and hematuria.  Musculoskeletal: Positive for arthralgias, gait problem, joint swelling and myalgias.  Skin: Positive for rash and wound.  Neurological: Negative for seizures and syncope.  Psychiatric/Behavioral: Negative for agitation and confusion.  All other systems reviewed and are negative.   Physical Exam Updated Vital Signs BP 137/68 (BP Location: Left Arm)   Pulse 79   Temp 97.6 F (36.4 C) (Oral)   Resp 18   Ht 5\' 5"  (1.651 m)   Wt 93.9 kg   SpO2 100%   BMI 34.45 kg/m   Physical Exam Vitals and nursing note reviewed.  Constitutional:      General: She is  not in acute distress.    Appearance: She is well-developed.  HENT:     Head: Normocephalic and atraumatic.  Eyes:     Conjunctiva/sclera: Conjunctivae normal.  Cardiovascular:     Rate and Rhythm: Normal rate and regular rhythm.     Heart sounds: Normal heart sounds.  Pulmonary:     Effort: Pulmonary effort is normal. No respiratory distress.     Breath sounds: Normal breath sounds.  Abdominal:     Palpations: Abdomen is soft.     Tenderness: There is no abdominal tenderness.  Musculoskeletal:     Cervical  back: Neck supple.     Comments: No deformity of left shoulder Pain with overhead extension of arm (chronic), no pain with additional ROM testing of the left shoulder  Skin:    General: Skin is warm and dry.     Comments: Post operative surgical site appears clean, sutures in tact, see photo below, mild edema around knee with minimal region of erythema  Neurological:     General: No focal deficit present.     Mental Status: She is alert and oriented to person, place, and time.        ED Results / Procedures / Treatments   Labs (all labs ordered are listed, but only abnormal results are displayed) Labs Reviewed  COMPREHENSIVE METABOLIC PANEL - Abnormal; Notable for the following components:      Result Value   Sodium 133 (*)    Chloride 95 (*)    Glucose, Bld 141 (*)    Total Protein 8.2 (*)    All other components within normal limits  CBC WITH DIFFERENTIAL/PLATELET - Abnormal; Notable for the following components:   RBC 3.32 (*)    Hemoglobin 9.6 (*)    HCT 29.3 (*)    Platelets 540 (*)    All other components within normal limits  URINALYSIS, ROUTINE W REFLEX MICROSCOPIC - Abnormal; Notable for the following components:   Glucose, UA >=500 (*)    All other components within normal limits  URINALYSIS, MICROSCOPIC (REFLEX) - Abnormal; Notable for the following components:   Bacteria, UA RARE (*)    All other components within normal limits     EKG None  Radiology No results found.  Procedures Procedures (including critical care time)  Medications Ordered in ED Medications  apixaban (ELIQUIS) tablet 2.5 mg (2.5 mg Oral Given 03/12/20 2053)  HYDROcodone-acetaminophen (NORCO/VICODIN) 5-325 MG per tablet 1 tablet (1 tablet Oral Given 03/12/20 1954)    ED Course  I have reviewed the triage vital signs and the nursing notes.  Pertinent labs & imaging results that were available during my care of the patient were reviewed by me and considered in my medical decision making (see chart for details).  74 yo female here with post-operative pain in her left knee after total knee replacement 2 weeks ago.  This has been persistent since her surgery. She describes feeling fatigued as well.  1. Knee pain  - Does not appear grossly infected to me, wounds appear clean.  Expected amount of edema following surgery.  No fever or leukocytosis here.  Okay to continue her home augmentin.  She has f/u with her surgeon tomorrow, which I emphasized is the most important thing now. - No hx or signs of DVT  2. Left shoulder pain  - Likely exacerbation of her chronic pain - muscular issue most likely, or frozen shoulder.  Worsened after her OR.  No hx of trauma to suggest acute fracture.  No deformity of the shoulder.  3.  Fatigue  - May also be post-operative stress.  Hgb is fine here (9.6, she was 7.7 at discharge).  Electrolytes and glucose wnl.  WBC normal.  No fever to suggest sepsis or infection.  No hypoxia or tachycardia to suggest PE.  She is on eliquis as clot ppx and compliant with it.  I personally reviewed her lab images today. Additional history was provided by her daughter who is her caretaker at home. Okay for discharge with close f/u tomorrow in office with her surgeon.   Final Clinical Impression(s) /  ED Diagnoses Final diagnoses:  Post-operative pain    Rx / DC Orders ED Discharge Orders    None       Edwina Grossberg, Kermit BaloMatthew  J, MD 03/13/20 0111

## 2020-03-12 NOTE — Discharge Instructions (Addendum)
Please follow up with Dr Veda Canning in the office tomorrow.

## 2020-03-12 NOTE — ED Notes (Signed)
Labs and urine taken to lab during [facility] downtime

## 2020-03-25 ENCOUNTER — Ambulatory Visit: Payer: Self-pay

## 2020-03-25 ENCOUNTER — Other Ambulatory Visit: Payer: Self-pay

## 2020-03-25 ENCOUNTER — Ambulatory Visit (INDEPENDENT_AMBULATORY_CARE_PROVIDER_SITE_OTHER): Payer: 59 | Admitting: Family Medicine

## 2020-03-25 ENCOUNTER — Encounter: Payer: Self-pay | Admitting: Family Medicine

## 2020-03-25 DIAGNOSIS — M25512 Pain in left shoulder: Secondary | ICD-10-CM

## 2020-03-25 DIAGNOSIS — G8929 Other chronic pain: Secondary | ICD-10-CM | POA: Diagnosis not present

## 2020-03-25 MED ORDER — DICLOFENAC SODIUM 1 % EX GEL: 4.0000 g | Freq: Four times a day (QID) | CUTANEOUS | 6 refills | Status: AC | PRN

## 2020-03-25 NOTE — Progress Notes (Signed)
Office Visit Note   Patient: Alexandria Gibbs           Date of Birth: [birth year]           MRN: [id number] Visit Date: 03/25/2020 Requested by: No referring provider defined for this encounter. PCP: Pcp, No  Subjective: Chief Complaint  Patient presents with  . Left Shoulder - Pain    Pain since a fall 2 years ago. She says she had a fracture, but no surgery for it (was placed in a sling). She continues to have pain on top of the shoulder and down the upper arm. Decreased ROM due to the pain.    HPI: She is here with left shoulder pain.  She fell 2 years ago and fractured her shoulder, she was treated elsewhere for it.  Eventually her pain improved but in the past couple months it has gotten worse again.  Pain is mostly on top of the shoulder and it hurts when reaching overhead.              ROS:   All other systems were reviewed and are negative.  Objective: Vital Signs: There were no vitals taken for this visit.  Physical Exam:  General:  Alert and oriented, in no acute distress. Pulm:  Breathing unlabored. Psy:  Normal mood, congruent affect.  Left shoulder: She has full active range of motion with pain at the extremes of overhead reach and behind the back reach.  She has mild tenderness at the [facility] joint.  She has moderate tenderness in the lateral subacromial space.  Isometric rotator cuff strength is 5/5 throughout.  Speeds test is negative.  AC crossover test is equivocal.  Imaging: XR Shoulder Left  Result Date: 03/25/2020 X-rays of the left shoulder reveal moderate AC joint arthropathy with spurring.  Glenohumeral joint looks good overall.  No soft tissue calcifications.  No sign of AVN.   Assessment & Plan: 1.  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 -Discussed options with her and she wants to try Voltaren gel, home exercises.  If symptoms persist we will either try physical therapy or a subacromial injection.     Procedures: No procedures performed  No  notes on file     PMFS History: Patient Active Problem List   Diagnosis Date Noted  . 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 Anemia   . Arthritis   . Diabetes mellitus without complication (HCC)   . GERD (gastroesophageal reflux disease)   . Headache   . History of kidney stones   . Hypertension   . Sleep apnea     History reviewed. No pertinent family history.  Past Surgical History:  Procedure Laterality Date  . ANKLE SURGERY Left    ligament repair  . CHOLECYSTECTOMY    . EYE SURGERY     cyst removal  . HAND SURGERY Right   . REPLACEMENT TOTAL KNEE BILATERAL    . TONSILLECTOMY    . TOTAL KNEE REVISION Left 02/27/2020   Procedure: TOTAL KNEE REVISION;  Surgeon: Samson Frederic, MD;  Location: [facility];  Service: Orthopedics;  Laterality: Left;  . WISDOM TOOTH EXTRACTION     Social History   Occupational History  . Not on file  Tobacco Use  . Smoking status: Never Smoker  . Smokeless tobacco: Never Used  Vaping Use  . Vaping Use: Never used  Substance  and Sexual Activity  . Alcohol use: Not Currently  . Drug use: Not Currently  . Sexual activity: Not on file

## 2020-04-13 ENCOUNTER — Ambulatory Visit: Payer: 59 | Admitting: Family Medicine
